# Patient Record
Sex: Female | Born: 1985
Health system: Southern US, Community
[De-identification: ages and names within clinical notes are randomized; demographics above are authoritative.]

## PROBLEM LIST (undated history)

## (undated) DIAGNOSIS — O24419 Gestational diabetes mellitus in pregnancy, unspecified control: Secondary | ICD-10-CM

---

## 2018-06-21 DIAGNOSIS — Z319 Encounter for procreative management, unspecified: Secondary | ICD-10-CM | POA: Diagnosis not present

## 2018-06-21 DIAGNOSIS — Z3141 Encounter for fertility testing: Secondary | ICD-10-CM | POA: Diagnosis not present

## 2018-06-21 DIAGNOSIS — N979 Female infertility, unspecified: Secondary | ICD-10-CM | POA: Diagnosis not present

## 2018-06-28 DIAGNOSIS — Z319 Encounter for procreative management, unspecified: Secondary | ICD-10-CM | POA: Diagnosis not present

## 2018-06-28 DIAGNOSIS — Z008 Encounter for other general examination: Secondary | ICD-10-CM | POA: Diagnosis not present

## 2018-06-28 DIAGNOSIS — Z6828 Body mass index (BMI) 28.0-28.9, adult: Secondary | ICD-10-CM | POA: Diagnosis not present

## 2018-06-30 DIAGNOSIS — N8500 Endometrial hyperplasia, unspecified: Secondary | ICD-10-CM | POA: Diagnosis not present

## 2018-06-30 DIAGNOSIS — Z3141 Encounter for fertility testing: Secondary | ICD-10-CM | POA: Diagnosis not present

## 2018-09-23 DIAGNOSIS — L988 Other specified disorders of the skin and subcutaneous tissue: Secondary | ICD-10-CM | POA: Diagnosis not present

## 2018-09-24 ENCOUNTER — Other Ambulatory Visit: Payer: Self-pay

## 2018-09-24 ENCOUNTER — Encounter (HOSPITAL_COMMUNITY): Payer: Self-pay | Admitting: Emergency Medicine

## 2018-09-24 ENCOUNTER — Ambulatory Visit (HOSPITAL_COMMUNITY)
Admission: EM | Admit: 2018-09-24 | Discharge: 2018-09-24 | Disposition: A | Discharge status: Home / Self Care | Payer: BC Managed Care – PPO | Attending: Family Medicine | Admitting: Family Medicine

## 2018-09-24 DIAGNOSIS — Z5189 Encounter for other specified aftercare: Secondary | ICD-10-CM | POA: Diagnosis not present

## 2018-09-24 NOTE — ED Provider Notes (Signed)
[facility]    CSN: [id number] Arrival date & time: 09/24/18  1429      History   Chief Complaint Chief Complaint  Patient presents with  . Wound Check    HPI Jennifer KicksSatya Lee is a 33 y.o. female.   HPI   Patient is here for a "wound check".  Patient was born in [place].  [place] [employer] [place]  Came here for work.  [place] to [place], and then down to the [place] area.  She developed a painful lump in her vaginal area.  Went yesterday to a different urgent care center.  She was told she had a Bartholin's abscess.  She was told that it was not ready to I&D, so she was given sulfa antibiotics and told to use warm compresses and sitz bath's. Today she filled the bathtub and used Epson salt as instructed.  While she was in the bathtub she felt the cyst rupture.  As she was draining the water she noted that the bottom of the tub had a cluster of warmness (see picture) that had come out of her cyst.  Her pain is improved since the rupture.  History reviewed. No pertinent past medical history.  There are no active problems to display for this patient.   Past Surgical History:  Procedure Laterality Date  . CESAREAN SECTION  2018    OB History   No obstetric history on file.      Home Medications    Prior to Admission medications   Medication Sig Start Date End Date Taking? Authorizing Provider  sulfamethoxazole-trimethoprim (BACTRIM DS) 800-160 MG tablet  09/23/18  Yes [provider]    Family History History reviewed. No pertinent family history.  Social History Social History   Tobacco Use  . Smoking status: Never Smoker  . Smokeless tobacco: Never Used  Substance Use Topics  . Alcohol use: Not on file  . Drug use: Never     Allergies   Patient has no known allergies.   Review of Systems Review of Systems  Constitutional: Negative for chills and fever.  HENT: Negative for ear pain and sore throat.   Eyes: Negative for  pain and visual disturbance.  Respiratory: Negative for cough and shortness of breath.   Cardiovascular: Negative for chest pain and palpitations.  Gastrointestinal: Negative for abdominal pain and vomiting.  Genitourinary: Negative for dysuria and hematuria.  Musculoskeletal: Negative for arthralgias and back pain.  Skin: Positive for wound. Negative for color change and rash.  Neurological: Negative for seizures and syncope.  All other systems reviewed and are negative.    Physical Exam Triage Vital Signs ED Triage Vitals  Enc Vitals Group     BP 09/24/18 1457 119/80     Pulse Rate 09/24/18 1457 82     Resp 09/24/18 1457 16     Temp 09/24/18 1457 98.4 F (36.9 C)     Temp Source 09/24/18 1457 Temporal     SpO2 09/24/18 1457 100 %     Weight --      Height --      Head Circumference --      Peak Flow --      Pain Score 09/24/18 1526 5     Pain Loc --      Pain Edu? --      Excl. in GC? --    No data found.  Updated Vital Signs BP 119/80 (BP Location: Left Arm)   Pulse 82  Temp 98.4 F (36.9 C) (Temporal)   Resp 16   LMP 09/20/2018 (Exact Date)   SpO2 100%      Physical Exam Constitutional:      General: She is not in acute distress.    Appearance: She is well-developed.  HENT:     Head: Normocephalic and atraumatic.  Eyes:     Conjunctiva/sclera: Conjunctivae normal.     Pupils: Pupils are equal, round, and reactive to light.  Neck:     Musculoskeletal: Normal range of motion.  Cardiovascular:     Rate and Rhythm: Normal rate.  Pulmonary:     Effort: Pulmonary effort is normal. No respiratory distress.  Abdominal:     General: There is no distension.     Palpations: Abdomen is soft.  Genitourinary:    General: Normal vulva.     Vagina: No vaginal discharge.    Musculoskeletal: Normal range of motion.  Skin:    General: Skin is warm and dry.  Neurological:     Mental Status: She is alert.        UC Treatments / Results  Labs (all labs  ordered are listed, but only abnormal results are displayed) Labs Reviewed - No data to display  EKG   Radiology No results found.  Procedures Procedures (including critical care time)  Medications Ordered in UC Medications - No data to display  Initial Impression / Assessment and Plan / UC Course  I have reviewed the triage vital signs and the nursing notes.  Pertinent labs & imaging results that were available during my care of the patient were reviewed by me and considered in my medical decision making (see chart for details).    I called Dr. Michel Bickers on the department of infectious disease.  Without an actual worm for specimen identification, it is impossible to tell what species of parasite was occupying this cyst.  Patient is encouraged to bring back a specimen if at all possible.  Final Clinical Impressions(s) / UC Diagnoses   Final diagnoses:  Visit for wound check     Discharge Instructions     I have called an infection specialist to try to identify the worms I have not heard back from them yet, but will call your cell phone when I get an answer If you pass any more worms please try to capture them in the container provided    ED Prescriptions    None     PDMP not reviewed this encounter.   Raylene Everts, MD 09/24/18 [phone]

## 2018-09-24 NOTE — Discharge Instructions (Signed)
I have called an infection specialist to try to identify the worms I have not heard back from them yet, but will call your cell phone when I get an answer If you pass any more worms please try to capture them in the container provided

## 2018-09-24 NOTE — ED Triage Notes (Signed)
Pt was seen yesterday for an abscess to her vagina.  She was prescribed Sulfamethoxazole-TMP. She took an Epsom salt bath today and when she went to get out she noticed a large pile of worms in the bath where she was sitting.

## 2018-09-25 ENCOUNTER — Emergency Department (HOSPITAL_COMMUNITY)
Admission: EM | Admit: 2018-09-25 | Discharge: 2018-09-25 | Disposition: A | Discharge status: Home / Self Care | Payer: BC Managed Care – PPO | Attending: Emergency Medicine | Admitting: Emergency Medicine

## 2018-09-25 ENCOUNTER — Other Ambulatory Visit: Payer: Self-pay

## 2018-09-25 DIAGNOSIS — B839 Helminthiasis, unspecified: Secondary | ICD-10-CM | POA: Diagnosis not present

## 2018-09-25 DIAGNOSIS — R102 Pelvic and perineal pain: Secondary | ICD-10-CM | POA: Diagnosis present

## 2018-09-25 DIAGNOSIS — N764 Abscess of vulva: Secondary | ICD-10-CM | POA: Insufficient documentation

## 2018-09-25 DIAGNOSIS — A4902 Methicillin resistant Staphylococcus aureus infection, unspecified site: Secondary | ICD-10-CM | POA: Diagnosis not present

## 2018-09-25 DIAGNOSIS — N750 Cyst of Bartholin's gland: Secondary | ICD-10-CM | POA: Diagnosis not present

## 2018-09-25 LAB — URINALYSIS, ROUTINE W REFLEX MICROSCOPIC
Bilirubin Urine: NEGATIVE
Glucose, UA: NEGATIVE mg/dL
Ketones, ur: NEGATIVE mg/dL
Nitrite: NEGATIVE
Protein, ur: NEGATIVE mg/dL
Specific Gravity, Urine: 1.002 — ABNORMAL LOW (ref 1.005–1.030)
pH: 6 (ref 5.0–8.0)

## 2018-09-25 LAB — PREGNANCY, URINE: Preg Test, Ur: NEGATIVE

## 2018-09-25 MED ORDER — LIDOCAINE-EPINEPHRINE (PF) 2 %-1:200000 IJ SOLN
10.0000 mL | Freq: Once | INTRAMUSCULAR | Status: DC
  Filled 2018-09-25: qty 20

## 2018-09-25 MED ORDER — HYDROCODONE-ACETAMINOPHEN 5-325 MG PO TABS: 1.0000 | ORAL_TABLET | Freq: Four times a day (QID) | ORAL | 0 refills | Status: DC | PRN

## 2018-09-25 MED ORDER — ALBENDAZOLE 200 MG PO TABS
400.0000 mg | ORAL_TABLET | Freq: Once | ORAL | Status: AC
  Administered 2018-09-25: 400 mg via ORAL
  Filled 2018-09-25: qty 2

## 2018-09-25 MED ORDER — HYDROCODONE-ACETAMINOPHEN 5-325 MG PO TABS: 1.0000 | ORAL_TABLET | Freq: Once | ORAL | Status: DC

## 2018-09-25 NOTE — ED Triage Notes (Signed)
Pt endorses vaginal abscess x 4 days, seen at [facility] and given antibiotics and has been taking sitz baths. Pt was taking sitz bath this morning and d/c came out of abscess that appears to be "worm like" VSS. Axox4.

## 2018-09-25 NOTE — Discharge Instructions (Signed)
Please make sure you complete entire course of antibiotics for labial abscess which was drained today, this should heal slowly from the inside out, continue sitz bath's to promote drainage and healing.  Return if you have worsening pain, swelling or fevers.  Regarding worms found yesterday I suspect these are likely coming from the colon and rectum rather than from the vaginal abscess, you were given 1 dose of a medication to hopefully take care of the warm infestation, but if you pass any more worms please collect and bring into urgent care or PCP.  Sometimes after taking medication to kill worms this can make you feel worse if you begin having fevers, headaches, vomiting, abdominal pain, diarrhea, blood in your stools or any other new or concerning symptoms do not hesitate to return to the ED.

## 2018-09-25 NOTE — ED Provider Notes (Signed)
[facility] Provider Note   CSN: [id number] Arrival date & time: 09/25/18  1111     History   Chief Complaint Chief Complaint  Patient presents with  . Abscess    HPI Jennifer Lee is a 33 y.o. female.     Jennifer Lee is a 33 y.o. female who is otherwise healthy, presents to the emergency department for evaluation of painful vaginal abscess.  She first noted this 4-5 days ago it has become increasingly painful, after doing sitz bath at home she got some relief, was seen at urgent care yesterday and given antibiotics.  At this time the area was already noted to have some drainage reported at home so was not incised.  Patient reports that since then she has had worsening pain in the area.  She reports that yesterday while doing a sitz bath she had an episode where she passed gas and then she noted a cluster of worms in the tub.  There is a photo present in urgent care note which shows numerous small white worms.  She reports today while doing a sitz bath she again noted 1 or 2 worms, attempted to collect them but was unable to, she was not able to bring in any warm samples yesterday either, so unable to send off organism for clear identification.  She has not had any fevers abdominal pain, vomiting or blood in her stools that she has noted.  Denies constipation.  She was placed on Bactrim yesterday and has been using ibuprofen intermittently for pain.  She reports no recent travel, is from [place] originally but has not been to [place] in 5 years, denies any unusual foods, no pets at home, denies swimming in any lakes rivers or oceans.  No other aggravating or alleviating factors.  She was not given any medication for treatment of warmth yesterday at urgent care, infectious disease was initially contacted, but had no recommendations without warm available for identification.     No past medical history on file.  There are no active problems to display for  this patient.   Past Surgical History:  Procedure Laterality Date  . CESAREAN SECTION  2018     OB History   No obstetric history on file.      Home Medications    Prior to Admission medications   Medication Sig Start Date End Date Taking? Authorizing Provider  sulfamethoxazole-trimethoprim (BACTRIM DS) 800-160 MG tablet Take 1 tablet by mouth 2 (two) times daily. For 10 days 09/23/18  Yes [provider]  HYDROcodone-acetaminophen (NORCO) 5-325 MG tablet Take 1 tablet by mouth every 6 (six) hours as needed. 09/25/18   Dartha LodgeFord, [place] Non[place] N, PA-C    Family History No family history on file.  Social History Social History   Tobacco Use  . Smoking status: Never Smoker  . Smokeless tobacco: Never Used  Substance Use Topics  . Alcohol use: Not on file  . Drug use: Never     Allergies   Patient has no known allergies.   Review of Systems Review of Systems  Constitutional: Negative for chills and fever.  HENT: Negative.   Respiratory: Negative for cough and shortness of breath.   Cardiovascular: Negative for chest pain.  Gastrointestinal: Negative for abdominal pain, blood in stool, constipation, diarrhea, nausea and vomiting.  Genitourinary: Positive for genital sores, vaginal bleeding and vaginal pain. Negative for dysuria, flank pain, frequency and vaginal discharge.  Musculoskeletal: Negative for arthralgias and myalgias.  Skin: Negative  for color change and rash.  All other systems reviewed and are negative.    Physical Exam Updated Vital Signs BP 118/81 (BP Location: Right Arm)   Pulse 90   Temp 97.9 F (36.6 C) (Oral)   Resp 18   LMP 09/20/2018 (Exact Date)   SpO2 100%   Physical Exam Vitals signs and nursing note reviewed.  Constitutional:      General: She is not in acute distress.    Appearance: Normal appearance. She is well-developed and normal weight. She is not ill-appearing or diaphoretic.  HENT:     Head: Normocephalic and atraumatic.   Eyes:     General:        Right eye: No discharge.        Left eye: No discharge.     Pupils: Pupils are equal, round, and reactive to light.  Neck:     Musculoskeletal: Neck supple.  Abdominal:     General: Bowel sounds are normal. There is no distension.     Palpations: Abdomen is soft. There is no mass.     Tenderness: There is no abdominal tenderness. There is no guarding.     Comments: Abdomen soft, nondistended, nontender to palpation in all quadrants without guarding or peritoneal signs  Genitourinary:    Comments: Chaperone present during pelvic exam. Right lower labia with area of swelling there is a pinpoint area of purulent drainage with some surrounding induration, no surrounding cellulitis. No other external genital lesions. Speculum exam reveals small amount of blood in the vaginal vault, no discharge, no lesions.  No worms noted. No cervical motion tenderness or palpable masses. Rectal exam reveals soft brown stool, no blood, no worms noted on rectal exam. Musculoskeletal:        General: No deformity.     Right lower leg: No edema.     Left lower leg: No edema.  Skin:    General: Skin is warm and dry.     Capillary Refill: Capillary refill takes less than 2 seconds.  Neurological:     Mental Status: She is alert.     Coordination: Coordination normal.     Comments: Speech is clear, able to follow commands Moves extremities without ataxia, coordination intact  Psychiatric:        Mood and Affect: Mood normal.        Behavior: Behavior normal.      ED Treatments / Results  Labs (all labs ordered are listed, but only abnormal results are displayed) Labs Reviewed  URINALYSIS, ROUTINE W REFLEX MICROSCOPIC - Abnormal; Notable for the following components:      Result Value   APPearance CLOUDY (*)    Specific Gravity, Urine 1.002 (*)    Hgb urine dipstick LARGE (*)    Leukocytes,Ua LARGE (*)    Bacteria, UA RARE (*)    All other components within normal  limits  WET PREP, GENITAL  OVA + PARASITE EXAM  PREGNANCY, URINE  GI PATHOGEN PANEL BY PCR, STOOL    EKG None  Radiology No results found.  Procedures .Marland KitchenIncision and Drainage  Date/Time: 09/25/2018 4:24 PM Performed by: Jacqlyn Larsen, PA-C Authorized by: Jacqlyn Larsen, PA-C   Consent:    Consent obtained:  Verbal   Risks discussed:  Bleeding, incomplete drainage, infection, damage to other organs and pain   Alternatives discussed:  No treatment Location:    Type:  Abscess (Right labia)   Location:  Anogenital   Anogenital location:  Vulva Pre-procedure  details:    Skin preparation:  Chloraprep Anesthesia (see MAR for exact dosages):    Anesthesia method:  Local infiltration   Local anesthetic:  Lidocaine 2% WITH epi Procedure type:    Complexity:  Simple Procedure details:    Incision types:  Single straight   Incision depth:  Dermal   Scalpel blade:  11   Wound management:  Probed and deloculated   Drainage:  Purulent and bloody   Drainage amount:  Moderate   Wound treatment:  Wound left open   Packing materials:  None Post-procedure details:    Patient tolerance of procedure:  Tolerated well, no immediate complications   (including critical care time)  Medications Ordered in ED Medications  lidocaine-EPINEPHrine (XYLOCAINE W/EPI) 2 %-1:200000 (PF) injection 10 mL (has no administration in time range)  HYDROcodone-acetaminophen (NORCO/VICODIN) 5-325 MG per tablet 1 tablet (has no administration in time range)  albendazole (ALBENZA) tablet 400 mg (400 mg Oral Given 09/25/18 1546)     Initial Impression / Assessment and Plan / ED Course  I have reviewed the triage vital signs and the nursing notes.  Pertinent labs & imaging results that were available during my care of the patient were reviewed by me and considered in my medical decision making (see chart for details).  33 year old female presents to the ED for evaluation of labial abscess, as well as  worms noted in bathtub after doing a sitz bath yesterday.  Has previously been seen in urgent care and started on Bactrim for abscess, was not drained at this time.  She noted some drainage from the abscess after sitz bath, and also noted cluster of worms, patient unsure whether worms came from rectum or from vaginal cyst.  She denies vaginal discharge, some bleeding due to menstrual cycle.  Has not had any fevers, abdominal pain or vomiting.  On my exam patient with left lower labial abscess with pinpoint area of drainage given that this is not open or ruptured I have an extremely low suspicion that this is the origin of pictured worms.  Pelvic exam revealed no worms present in the vaginal vault either, these forms are present after patient passed gas and I suspect that that is much more likely the etiology of these worms.  Patient unable to give stool sample here in the ED.  Incision and drainage of labial abscess performed with moderate drainage, left open for continued draining and will have patient complete antibiotics.  Urinanalysis with some bacteria and white blood cells I suspect this is most likely from the drainage from abscess, patient is already on Bactrim which would likely treat for your UTI in addition to skin infection.  She is not having any focal urinary symptoms fevers or flank pain.  Patient given one-time dose of albendazole here hopefully to treat infestation.  Patient notified that sometimes you can have worsening symptoms as worms are killed off and return precautions discussed.  She has been instructed to bring in any samples of the worms to the urgent care if she passes any additional worms.  Encouraged to continue doing sitz bath's.  Return precautions discussed.  Patient expresses understanding and agreement with plan.  Discharged home in good condition.  Case discussed with Dr. Pilar Plate who was available for consult and helped guide patient's care.  Final Clinical Impressions(s) / ED  Diagnoses   Final diagnoses:  Labial abscess  Worm infestation    ED Discharge Orders         Ordered  HYDROcodone-acetaminophen (NORCO) 5-325 MG tablet  Every 6 hours PRN     09/25/18 1525           Dartha LodgeFord, Jamisen Hawes N, [place]PA-C 09/25/18 1641    Sabas SousBero, Michael M, MD 09/26/18 [phone]

## 2018-10-01 DIAGNOSIS — N764 Abscess of vulva: Secondary | ICD-10-CM | POA: Diagnosis not present

## 2018-10-01 DIAGNOSIS — Z01419 Encounter for gynecological examination (general) (routine) without abnormal findings: Secondary | ICD-10-CM | POA: Diagnosis not present

## 2018-10-18 DIAGNOSIS — Z23 Encounter for immunization: Secondary | ICD-10-CM | POA: Diagnosis not present

## 2018-10-25 DIAGNOSIS — Z0000 Encounter for general adult medical examination without abnormal findings: Secondary | ICD-10-CM | POA: Diagnosis not present

## 2018-11-05 DIAGNOSIS — Z113 Encounter for screening for infections with a predominantly sexual mode of transmission: Secondary | ICD-10-CM | POA: Diagnosis not present

## 2018-11-19 DIAGNOSIS — Z3201 Encounter for pregnancy test, result positive: Secondary | ICD-10-CM | POA: Diagnosis not present

## 2018-11-19 DIAGNOSIS — Z3200 Encounter for pregnancy test, result unknown: Secondary | ICD-10-CM | POA: Diagnosis not present

## 2018-11-22 DIAGNOSIS — Z3201 Encounter for pregnancy test, result positive: Secondary | ICD-10-CM | POA: Diagnosis not present

## 2018-11-22 DIAGNOSIS — Z3200 Encounter for pregnancy test, result unknown: Secondary | ICD-10-CM | POA: Diagnosis not present

## 2018-12-03 DIAGNOSIS — N390 Urinary tract infection, site not specified: Secondary | ICD-10-CM | POA: Diagnosis not present

## 2018-12-03 DIAGNOSIS — Z3200 Encounter for pregnancy test, result unknown: Secondary | ICD-10-CM | POA: Diagnosis not present

## 2018-12-09 DIAGNOSIS — O0900 Supervision of pregnancy with history of infertility, unspecified trimester: Secondary | ICD-10-CM | POA: Diagnosis not present

## 2018-12-20 DIAGNOSIS — O021 Missed abortion: Secondary | ICD-10-CM | POA: Diagnosis not present

## 2018-12-20 DIAGNOSIS — O200 Threatened abortion: Secondary | ICD-10-CM | POA: Diagnosis not present

## 2018-12-23 DIAGNOSIS — O021 Missed abortion: Secondary | ICD-10-CM | POA: Diagnosis not present

## 2019-02-07 DIAGNOSIS — Z6830 Body mass index (BMI) 30.0-30.9, adult: Secondary | ICD-10-CM | POA: Diagnosis not present

## 2019-02-07 DIAGNOSIS — Z01419 Encounter for gynecological examination (general) (routine) without abnormal findings: Secondary | ICD-10-CM | POA: Diagnosis not present

## 2019-02-07 DIAGNOSIS — L718 Other rosacea: Secondary | ICD-10-CM | POA: Diagnosis not present

## 2019-02-07 DIAGNOSIS — L819 Disorder of pigmentation, unspecified: Secondary | ICD-10-CM | POA: Diagnosis not present

## 2019-02-07 DIAGNOSIS — R635 Abnormal weight gain: Secondary | ICD-10-CM | POA: Diagnosis not present

## 2019-02-07 DIAGNOSIS — L218 Other seborrheic dermatitis: Secondary | ICD-10-CM | POA: Diagnosis not present

## 2019-02-11 DIAGNOSIS — D251 Intramural leiomyoma of uterus: Secondary | ICD-10-CM | POA: Diagnosis not present

## 2019-02-11 DIAGNOSIS — N899 Noninflammatory disorder of vagina, unspecified: Secondary | ICD-10-CM | POA: Diagnosis not present

## 2019-02-11 DIAGNOSIS — Z3141 Encounter for fertility testing: Secondary | ICD-10-CM | POA: Diagnosis not present

## 2019-02-11 DIAGNOSIS — Z319 Encounter for procreative management, unspecified: Secondary | ICD-10-CM | POA: Diagnosis not present

## 2019-02-11 DIAGNOSIS — N83291 Other ovarian cyst, right side: Secondary | ICD-10-CM | POA: Diagnosis not present

## 2019-02-11 DIAGNOSIS — N856 Intrauterine synechiae: Secondary | ICD-10-CM | POA: Diagnosis not present

## 2019-05-09 DIAGNOSIS — R599 Enlarged lymph nodes, unspecified: Secondary | ICD-10-CM | POA: Diagnosis not present

## 2019-06-03 ENCOUNTER — Other Ambulatory Visit: Payer: Self-pay | Admitting: Internal Medicine

## 2019-06-03 DIAGNOSIS — R599 Enlarged lymph nodes, unspecified: Secondary | ICD-10-CM

## 2019-06-15 ENCOUNTER — Other Ambulatory Visit: Payer: BC Managed Care – PPO

## 2019-06-21 ENCOUNTER — Ambulatory Visit
Admission: RE | Admit: 2019-06-21 | Discharge: 2019-06-21 | Disposition: A | Discharge status: Home / Self Care | Payer: BC Managed Care – PPO | Source: Ambulatory Visit | Attending: Internal Medicine | Admitting: Internal Medicine

## 2019-06-21 DIAGNOSIS — R599 Enlarged lymph nodes, unspecified: Secondary | ICD-10-CM

## 2019-06-21 DIAGNOSIS — R221 Localized swelling, mass and lump, neck: Secondary | ICD-10-CM | POA: Diagnosis not present

## 2019-09-13 DIAGNOSIS — J029 Acute pharyngitis, unspecified: Secondary | ICD-10-CM | POA: Diagnosis not present

## 2019-09-14 DIAGNOSIS — R05 Cough: Secondary | ICD-10-CM | POA: Diagnosis not present

## 2019-09-15 DIAGNOSIS — K120 Recurrent oral aphthae: Secondary | ICD-10-CM | POA: Diagnosis not present

## 2019-09-15 DIAGNOSIS — J069 Acute upper respiratory infection, unspecified: Secondary | ICD-10-CM | POA: Diagnosis not present

## 2019-09-17 DIAGNOSIS — J029 Acute pharyngitis, unspecified: Secondary | ICD-10-CM | POA: Diagnosis not present

## 2019-09-17 DIAGNOSIS — B349 Viral infection, unspecified: Secondary | ICD-10-CM | POA: Diagnosis not present

## 2019-09-17 DIAGNOSIS — R05 Cough: Secondary | ICD-10-CM | POA: Diagnosis not present

## 2019-09-21 DIAGNOSIS — N856 Intrauterine synechiae: Secondary | ICD-10-CM | POA: Diagnosis not present

## 2019-09-21 DIAGNOSIS — D252 Subserosal leiomyoma of uterus: Secondary | ICD-10-CM | POA: Diagnosis not present

## 2019-10-04 DIAGNOSIS — S29011A Strain of muscle and tendon of front wall of thorax, initial encounter: Secondary | ICD-10-CM | POA: Diagnosis not present

## 2019-10-04 DIAGNOSIS — M7712 Lateral epicondylitis, left elbow: Secondary | ICD-10-CM | POA: Diagnosis not present

## 2019-10-07 DIAGNOSIS — N856 Intrauterine synechiae: Secondary | ICD-10-CM | POA: Diagnosis not present

## 2019-10-16 DIAGNOSIS — M778 Other enthesopathies, not elsewhere classified: Secondary | ICD-10-CM | POA: Diagnosis not present

## 2019-10-17 DIAGNOSIS — Z23 Encounter for immunization: Secondary | ICD-10-CM | POA: Diagnosis not present

## 2019-11-11 DIAGNOSIS — Z3141 Encounter for fertility testing: Secondary | ICD-10-CM | POA: Diagnosis not present

## 2019-11-11 DIAGNOSIS — Z3202 Encounter for pregnancy test, result negative: Secondary | ICD-10-CM | POA: Diagnosis not present

## 2019-12-07 DIAGNOSIS — Z113 Encounter for screening for infections with a predominantly sexual mode of transmission: Secondary | ICD-10-CM | POA: Diagnosis not present

## 2019-12-30 DIAGNOSIS — R0781 Pleurodynia: Secondary | ICD-10-CM | POA: Diagnosis not present

## 2020-01-07 HISTORY — DX: Maternal care for unspecified type scar from previous cesarean delivery: O34.219

## 2020-01-25 DIAGNOSIS — Z20822 Contact with and (suspected) exposure to covid-19: Secondary | ICD-10-CM | POA: Diagnosis not present

## 2020-02-01 DIAGNOSIS — M25562 Pain in left knee: Secondary | ICD-10-CM | POA: Diagnosis not present

## 2020-02-10 DIAGNOSIS — M25562 Pain in left knee: Secondary | ICD-10-CM | POA: Diagnosis not present

## 2020-02-10 DIAGNOSIS — M222X2 Patellofemoral disorders, left knee: Secondary | ICD-10-CM | POA: Diagnosis not present

## 2020-02-14 DIAGNOSIS — Z113 Encounter for screening for infections with a predominantly sexual mode of transmission: Secondary | ICD-10-CM | POA: Diagnosis not present

## 2020-03-01 DIAGNOSIS — Z3201 Encounter for pregnancy test, result positive: Secondary | ICD-10-CM | POA: Diagnosis not present

## 2020-03-01 DIAGNOSIS — Z3200 Encounter for pregnancy test, result unknown: Secondary | ICD-10-CM | POA: Diagnosis not present

## 2020-03-05 DIAGNOSIS — Z3200 Encounter for pregnancy test, result unknown: Secondary | ICD-10-CM | POA: Diagnosis not present

## 2020-03-05 DIAGNOSIS — Z3201 Encounter for pregnancy test, result positive: Secondary | ICD-10-CM | POA: Diagnosis not present

## 2020-03-14 DIAGNOSIS — Z3200 Encounter for pregnancy test, result unknown: Secondary | ICD-10-CM | POA: Diagnosis not present

## 2020-03-22 DIAGNOSIS — O26851 Spotting complicating pregnancy, first trimester: Secondary | ICD-10-CM | POA: Diagnosis not present

## 2020-04-04 DIAGNOSIS — O0900 Supervision of pregnancy with history of infertility, unspecified trimester: Secondary | ICD-10-CM | POA: Diagnosis not present

## 2020-04-13 DIAGNOSIS — O09811 Supervision of pregnancy resulting from assisted reproductive technology, first trimester: Secondary | ICD-10-CM | POA: Diagnosis not present

## 2020-04-13 DIAGNOSIS — Z3689 Encounter for other specified antenatal screening: Secondary | ICD-10-CM | POA: Diagnosis not present

## 2020-04-13 DIAGNOSIS — O09891 Supervision of other high risk pregnancies, first trimester: Secondary | ICD-10-CM | POA: Diagnosis not present

## 2020-04-13 DIAGNOSIS — Z3A10 10 weeks gestation of pregnancy: Secondary | ICD-10-CM | POA: Diagnosis not present

## 2020-04-13 LAB — OB RESULTS CONSOLE VARICELLA ZOSTER ANTIBODY, IGG: Varicella: IMMUNE

## 2020-04-13 LAB — OB RESULTS CONSOLE ABO/RH: RH Type: POSITIVE

## 2020-04-13 LAB — OB RESULTS CONSOLE HEPATITIS B SURFACE ANTIGEN: Hepatitis B Surface Ag: NEGATIVE

## 2020-04-13 LAB — OB RESULTS CONSOLE HIV ANTIBODY (ROUTINE TESTING): HIV: NONREACTIVE

## 2020-04-13 LAB — OB RESULTS CONSOLE GC/CHLAMYDIA
Chlamydia: NEGATIVE
Gonorrhea: NEGATIVE

## 2020-04-13 LAB — OB RESULTS CONSOLE RPR: RPR: NONREACTIVE

## 2020-04-13 LAB — OB RESULTS CONSOLE RUBELLA ANTIBODY, IGM: Rubella: IMMUNE

## 2020-04-24 DIAGNOSIS — Z331 Pregnant state, incidental: Secondary | ICD-10-CM | POA: Diagnosis not present

## 2020-04-24 DIAGNOSIS — Z3A11 11 weeks gestation of pregnancy: Secondary | ICD-10-CM | POA: Diagnosis not present

## 2020-04-24 DIAGNOSIS — O26891 Other specified pregnancy related conditions, first trimester: Secondary | ICD-10-CM | POA: Diagnosis not present

## 2020-04-24 DIAGNOSIS — R1032 Left lower quadrant pain: Secondary | ICD-10-CM | POA: Diagnosis not present

## 2020-05-25 DIAGNOSIS — Z3A16 16 weeks gestation of pregnancy: Secondary | ICD-10-CM | POA: Diagnosis not present

## 2020-05-25 DIAGNOSIS — Z361 Encounter for antenatal screening for raised alphafetoprotein level: Secondary | ICD-10-CM | POA: Diagnosis not present

## 2020-05-25 DIAGNOSIS — O09812 Supervision of pregnancy resulting from assisted reproductive technology, second trimester: Secondary | ICD-10-CM | POA: Diagnosis not present

## 2020-06-15 DIAGNOSIS — O09812 Supervision of pregnancy resulting from assisted reproductive technology, second trimester: Secondary | ICD-10-CM | POA: Diagnosis not present

## 2020-06-15 DIAGNOSIS — Z3A19 19 weeks gestation of pregnancy: Secondary | ICD-10-CM | POA: Diagnosis not present

## 2020-06-15 DIAGNOSIS — Z363 Encounter for antenatal screening for malformations: Secondary | ICD-10-CM | POA: Diagnosis not present

## 2020-06-29 DIAGNOSIS — Z362 Encounter for other antenatal screening follow-up: Secondary | ICD-10-CM | POA: Diagnosis not present

## 2020-07-27 DIAGNOSIS — Z362 Encounter for other antenatal screening follow-up: Secondary | ICD-10-CM | POA: Diagnosis not present

## 2020-08-17 DIAGNOSIS — Z3689 Encounter for other specified antenatal screening: Secondary | ICD-10-CM | POA: Diagnosis not present

## 2020-08-17 DIAGNOSIS — Z23 Encounter for immunization: Secondary | ICD-10-CM | POA: Diagnosis not present

## 2020-08-17 DIAGNOSIS — O43123 Velamentous insertion of umbilical cord, third trimester: Secondary | ICD-10-CM | POA: Diagnosis not present

## 2020-08-17 DIAGNOSIS — Z3A28 28 weeks gestation of pregnancy: Secondary | ICD-10-CM | POA: Diagnosis not present

## 2020-08-22 DIAGNOSIS — Z3A28 28 weeks gestation of pregnancy: Secondary | ICD-10-CM | POA: Diagnosis not present

## 2020-08-22 DIAGNOSIS — O99810 Abnormal glucose complicating pregnancy: Secondary | ICD-10-CM | POA: Diagnosis not present

## 2020-09-12 ENCOUNTER — Encounter: Payer: BC Managed Care – PPO | Attending: Obstetrics & Gynecology | Admitting: Registered"

## 2020-09-12 ENCOUNTER — Other Ambulatory Visit: Payer: Self-pay

## 2020-09-12 ENCOUNTER — Encounter: Payer: Self-pay | Admitting: Registered"

## 2020-09-12 DIAGNOSIS — O24419 Gestational diabetes mellitus in pregnancy, unspecified control: Secondary | ICD-10-CM

## 2020-09-12 NOTE — Progress Notes (Signed)
Patient was seen on 09/11/20 for Gestational Diabetes self-management class at [facility]. The following learning objectives were met by the patient during this course:  States the definition of Gestational Diabetes States why dietary management is important in controlling blood glucose Describes the effects each nutrient has on blood glucose levels Demonstrates ability to create a balanced meal plan Demonstrates carbohydrate counting  States when to check blood glucose levels Demonstrates proper blood glucose monitoring techniques States the effect of stress and exercise on blood glucose levels States the importance of limiting caffeine and abstaining from alcohol and smoking  Blood glucose monitor given: Patient has meter and is checking blood sugar prior to class   Patient instructed to monitor glucose levels: FBS: 60 - <95; 1 hour: <140; 2 hour: <120  Patient received handouts: Nutrition Diabetes and Pregnancy, including carb counting list  Patient will be seen for follow-up as needed. 

## 2020-09-14 DIAGNOSIS — O09813 Supervision of pregnancy resulting from assisted reproductive technology, third trimester: Secondary | ICD-10-CM | POA: Diagnosis not present

## 2020-09-14 DIAGNOSIS — Z3A32 32 weeks gestation of pregnancy: Secondary | ICD-10-CM | POA: Diagnosis not present

## 2020-09-21 DIAGNOSIS — Z3A33 33 weeks gestation of pregnancy: Secondary | ICD-10-CM | POA: Diagnosis not present

## 2020-09-21 DIAGNOSIS — O24415 Gestational diabetes mellitus in pregnancy, controlled by oral hypoglycemic drugs: Secondary | ICD-10-CM | POA: Diagnosis not present

## 2020-09-21 DIAGNOSIS — O24419 Gestational diabetes mellitus in pregnancy, unspecified control: Secondary | ICD-10-CM | POA: Diagnosis not present

## 2020-09-25 IMAGING — US US SOFT TISSUE HEAD/NECK
1 series · 13 of 13 positions shown · non-contrast
Comparison: None available.

CLINICAL DATA: Initial evaluation for swelling of lymph node.
Palpable area at left supraclavicular region since COVID vaccine 2
months ago.

EXAM:
ULTRASOUND OF HEAD/NECK SOFT TISSUES
TECHNIQUE: Ultrasound examination of the head and neck soft tissues was
performed in the area of clinical concern.

[Series 1: us soft tissue head/neck · 0.07mm/px · 13 acquisitions, 13 frames shown]
[im 1/13]
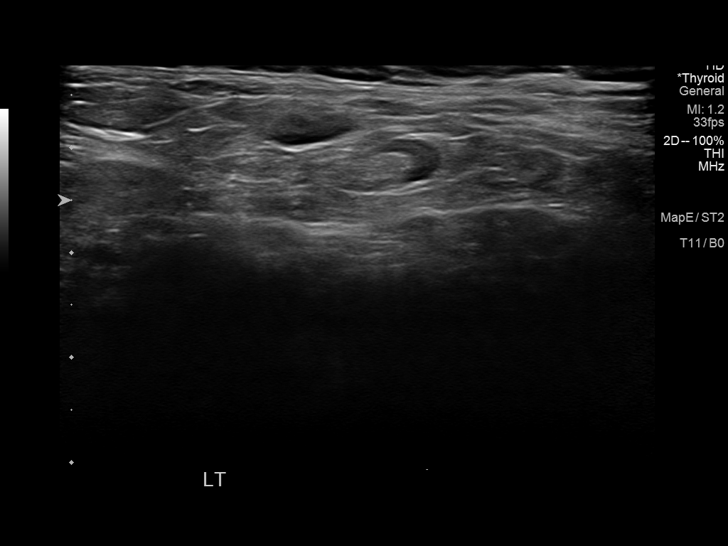
[im 2/13]
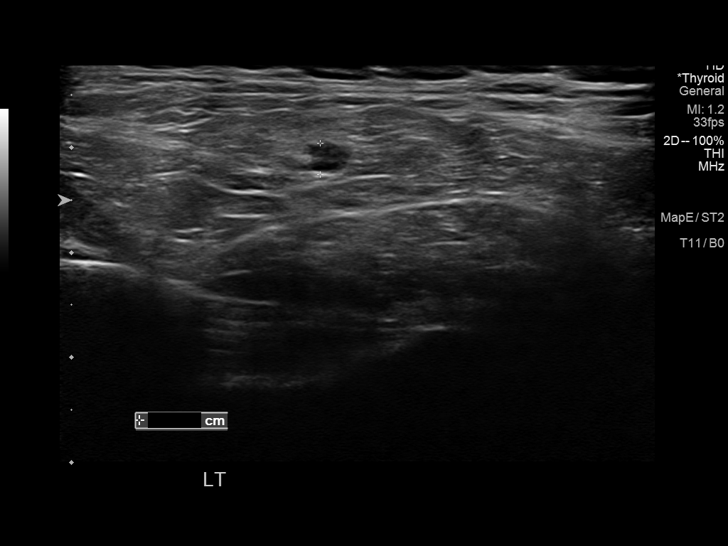
[im 3/13]
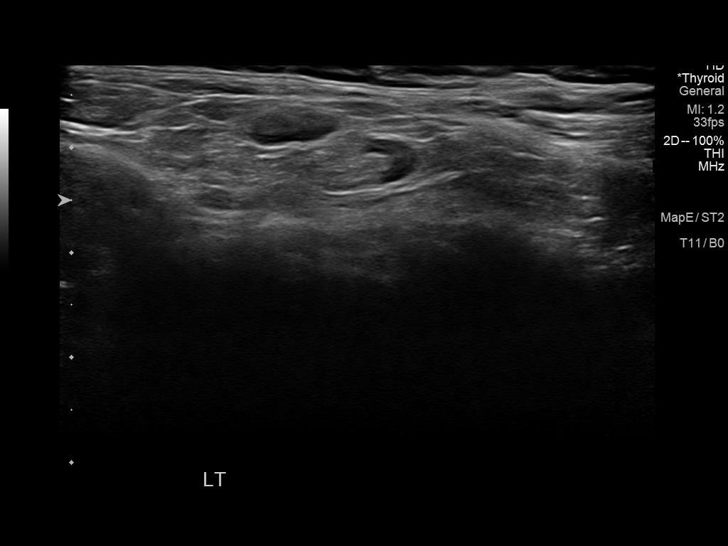
[im 4/13]
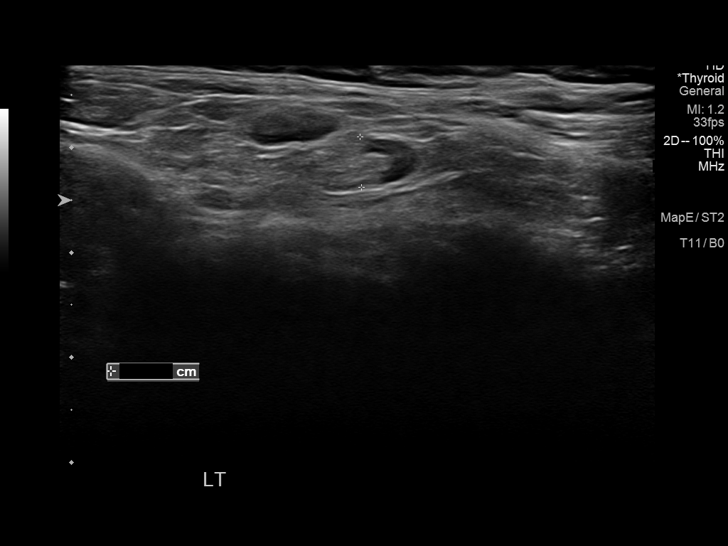
[im 5/13]
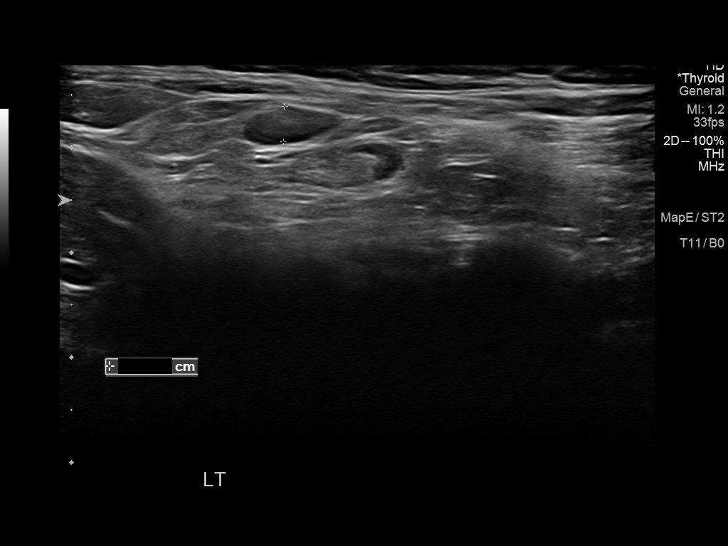
[im 6/13]
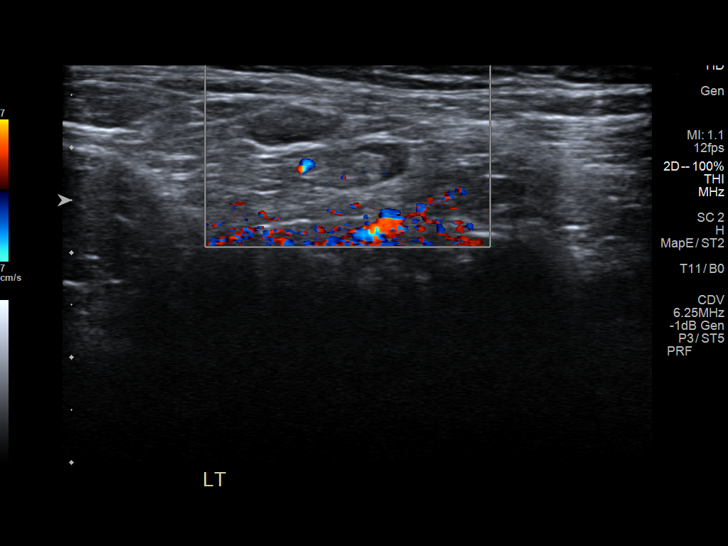
[im 7/13]
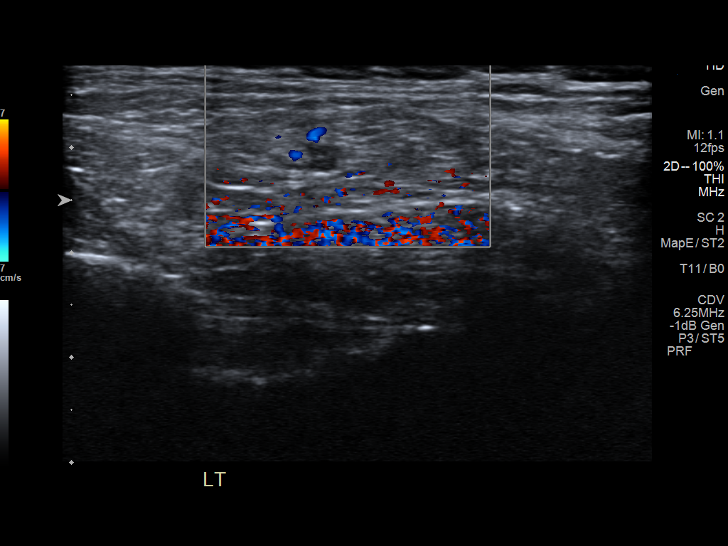
[im 8/13]
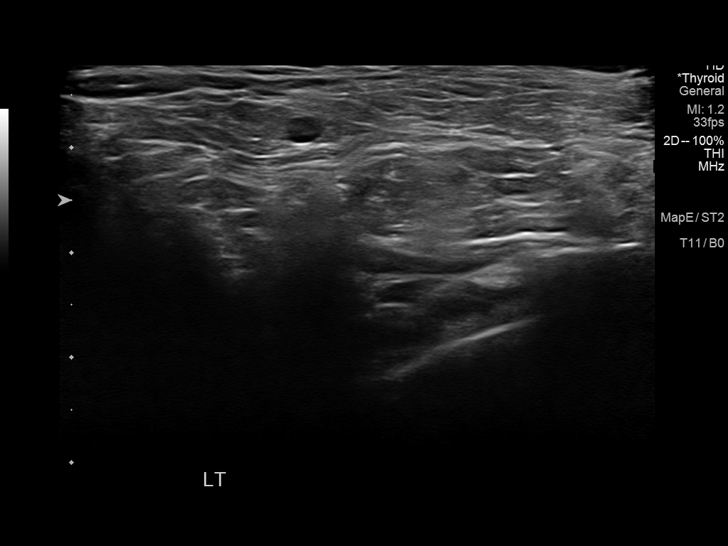
[im 9/13]
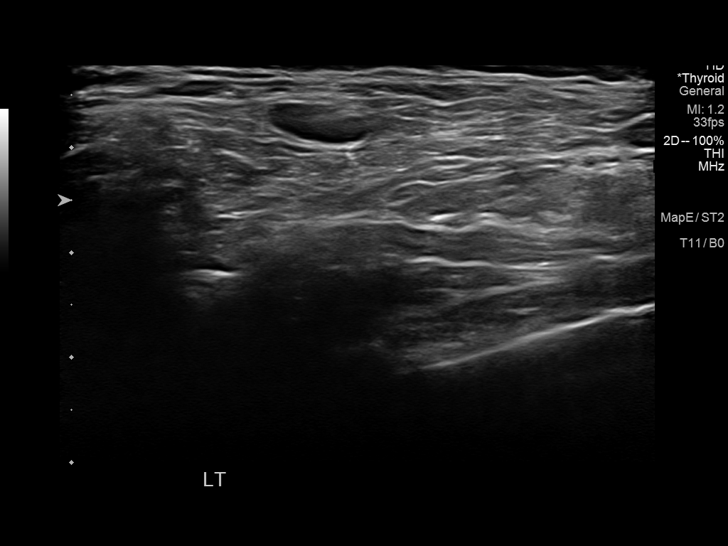
[im 10/13]
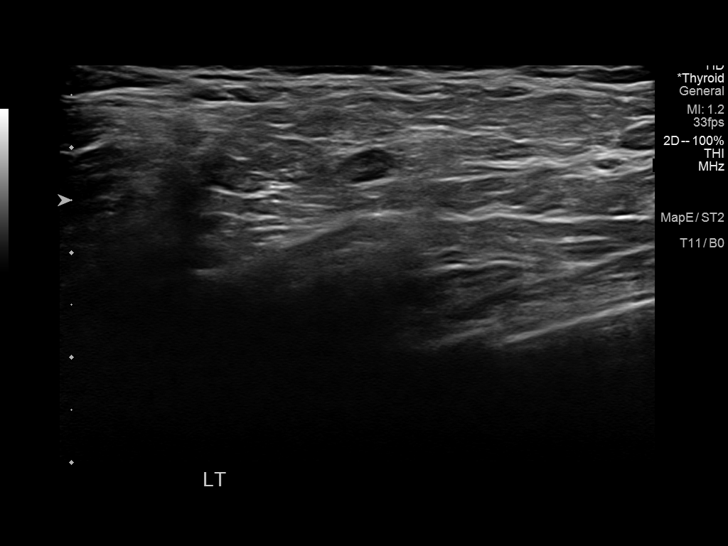
[im 11/13]
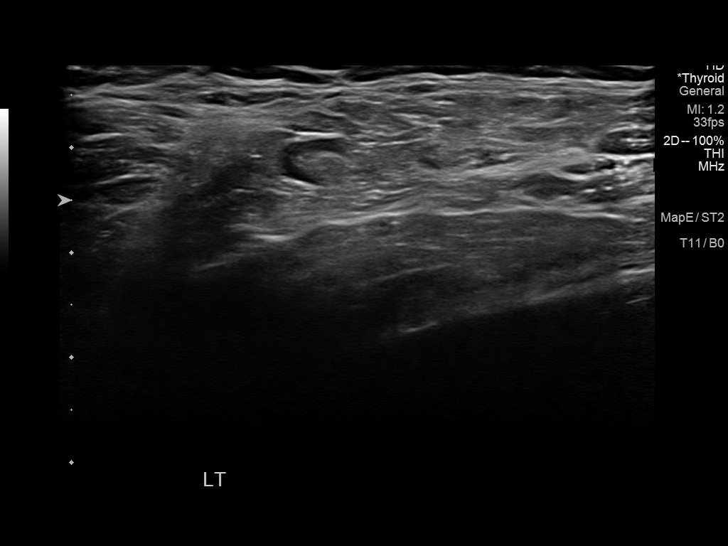
[im 12/13]
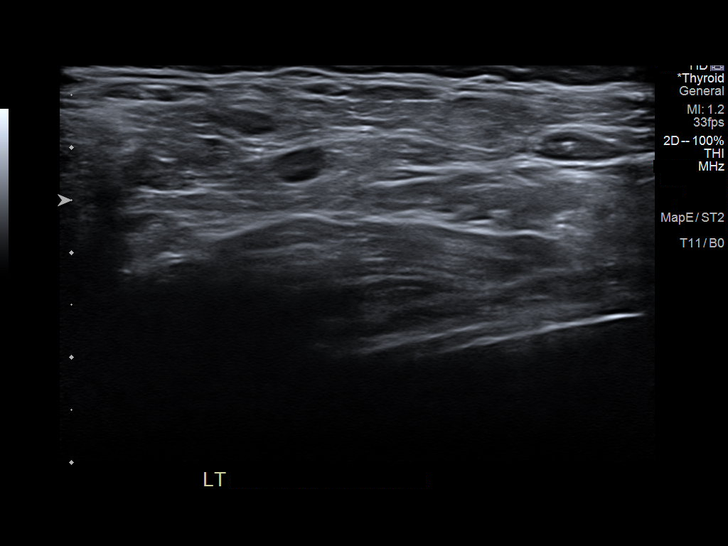
[im 13/13]
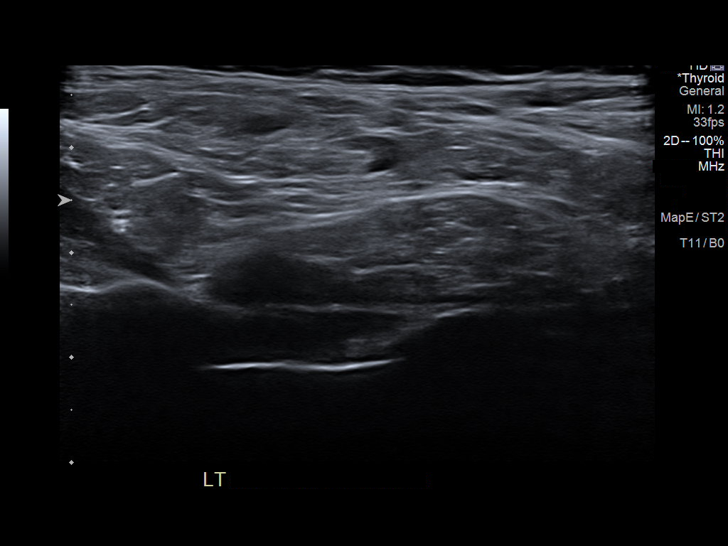

[13 of 13 positions shown; findings below may reference images not displayed]

FINDINGS: Targeted ultrasound of a palpable abnormality of concern at the left
supraclavicular fossa was performed. Ultrasound demonstrates 3
adjacent supraclavicular lymph nodes positioned at this location.
These nodes are within normal limits for size measuring 5 mm in
short axis. Nodes demonstrate a normal architecture with preserved
fatty hilum, with no concerning pathologic features identified.

The surrounding soft tissues are within normal limits. No other
discrete mass, collection, or other abnormality.
IMPRESSION: Palpable abnormality of concern at the left supraclavicular fossa
corresponds with a cluster of 3 normal appearing lymph nodes, all of
which are within normal limits for size measuring no more than 5 mm
in short axis. Findings likely reactive in nature given provided
history of recent COVID vaccination. Clinical follow-up to
resolution recommended.

## 2020-09-26 DIAGNOSIS — O24415 Gestational diabetes mellitus in pregnancy, controlled by oral hypoglycemic drugs: Secondary | ICD-10-CM | POA: Diagnosis not present

## 2020-09-26 DIAGNOSIS — O24414 Gestational diabetes mellitus in pregnancy, insulin controlled: Secondary | ICD-10-CM | POA: Diagnosis not present

## 2020-09-26 DIAGNOSIS — Z3A33 33 weeks gestation of pregnancy: Secondary | ICD-10-CM | POA: Diagnosis not present

## 2020-10-01 ENCOUNTER — Other Ambulatory Visit: Payer: Self-pay | Admitting: Obstetrics & Gynecology

## 2020-10-02 DIAGNOSIS — O24414 Gestational diabetes mellitus in pregnancy, insulin controlled: Secondary | ICD-10-CM | POA: Diagnosis not present

## 2020-10-02 DIAGNOSIS — Z3A34 34 weeks gestation of pregnancy: Secondary | ICD-10-CM | POA: Diagnosis not present

## 2020-10-10 DIAGNOSIS — Z23 Encounter for immunization: Secondary | ICD-10-CM | POA: Diagnosis not present

## 2020-10-10 DIAGNOSIS — Z3A35 35 weeks gestation of pregnancy: Secondary | ICD-10-CM | POA: Diagnosis not present

## 2020-10-10 DIAGNOSIS — Z3689 Encounter for other specified antenatal screening: Secondary | ICD-10-CM | POA: Diagnosis not present

## 2020-10-10 DIAGNOSIS — O24414 Gestational diabetes mellitus in pregnancy, insulin controlled: Secondary | ICD-10-CM | POA: Diagnosis not present

## 2020-10-10 LAB — OB RESULTS CONSOLE GBS: GBS: NEGATIVE

## 2020-10-17 DIAGNOSIS — O24414 Gestational diabetes mellitus in pregnancy, insulin controlled: Secondary | ICD-10-CM | POA: Diagnosis not present

## 2020-10-17 DIAGNOSIS — Z3A36 36 weeks gestation of pregnancy: Secondary | ICD-10-CM | POA: Diagnosis not present

## 2020-10-24 DIAGNOSIS — O24414 Gestational diabetes mellitus in pregnancy, insulin controlled: Secondary | ICD-10-CM | POA: Diagnosis not present

## 2020-10-24 DIAGNOSIS — Z3A37 37 weeks gestation of pregnancy: Secondary | ICD-10-CM | POA: Diagnosis not present

## 2020-10-26 ENCOUNTER — Encounter (HOSPITAL_COMMUNITY): Payer: Self-pay | Admitting: *Deleted

## 2020-10-26 NOTE — Patient Instructions (Addendum)
Alix Lahmann  10/26/2020   Your procedure is scheduled on:  11/05/2020  Arrive at 0800 at Entrance C on [address] at [facility]  and [employer]. You are invited to use the FREE valet parking or use the Visitor's parking deck.  Pick up the phone at the desk and dial [phone].  Call this number if you have problems the morning of surgery: [phone]  Remember:   Do not eat food:(After Midnight) Desps de medianoche.  Do not drink clear liquids: (After Midnight) Desps de medianoche.  Take these medicines the morning of surgery with A SIP OF WATER:  Take half of the insulin you take at bedtime the night before your surgery   Do not wear jewelry, make-up or nail polish.  Do not wear lotions, powders, or perfumes. Do not wear deodorant.  Do not shave 48 hours prior to surgery.  Do not bring valuables to the hospital.  [facility] is not   responsible for any belongings or valuables brought to the hospital.  Contacts, dentures or bridgework may not be worn into surgery.  Leave suitcase in the car. After surgery it may be brought to your room.  For patients admitted to the hospital, checkout time is 11:00 AM the day of              discharge.      Please read over the following fact sheets that you were given:     Preparing for Surgery

## 2020-10-31 ENCOUNTER — Encounter (HOSPITAL_COMMUNITY): Payer: Self-pay | Admitting: Obstetrics & Gynecology

## 2020-10-31 DIAGNOSIS — O24414 Gestational diabetes mellitus in pregnancy, insulin controlled: Secondary | ICD-10-CM | POA: Diagnosis not present

## 2020-10-31 DIAGNOSIS — Z3A38 38 weeks gestation of pregnancy: Secondary | ICD-10-CM | POA: Diagnosis not present

## 2020-11-02 ENCOUNTER — Other Ambulatory Visit: Payer: Self-pay

## 2020-11-02 ENCOUNTER — Other Ambulatory Visit (HOSPITAL_COMMUNITY)
Admission: RE | Admit: 2020-11-02 | Discharge: 2020-11-02 | Disposition: A | Discharge status: Home / Self Care | Payer: BC Managed Care – PPO | Source: Ambulatory Visit | Attending: Obstetrics & Gynecology | Admitting: Obstetrics & Gynecology

## 2020-11-02 ENCOUNTER — Encounter (HOSPITAL_COMMUNITY)
Admission: RE | Admit: 2020-11-02 | Discharge: 2020-11-02 | Disposition: A | Discharge status: Home / Self Care | Payer: BC Managed Care – PPO | Source: Ambulatory Visit | Attending: Obstetrics & Gynecology | Admitting: Obstetrics & Gynecology

## 2020-11-02 ENCOUNTER — Other Ambulatory Visit: Payer: Self-pay | Admitting: Obstetrics & Gynecology

## 2020-11-02 DIAGNOSIS — Z01812 Encounter for preprocedural laboratory examination: Secondary | ICD-10-CM | POA: Insufficient documentation

## 2020-11-02 DIAGNOSIS — Z3A39 39 weeks gestation of pregnancy: Secondary | ICD-10-CM | POA: Diagnosis not present

## 2020-11-02 DIAGNOSIS — O43193 Other malformation of placenta, third trimester: Secondary | ICD-10-CM | POA: Diagnosis not present

## 2020-11-02 DIAGNOSIS — O34211 Maternal care for low transverse scar from previous cesarean delivery: Secondary | ICD-10-CM | POA: Diagnosis not present

## 2020-11-02 DIAGNOSIS — O99214 Obesity complicating childbirth: Secondary | ICD-10-CM | POA: Diagnosis not present

## 2020-11-02 DIAGNOSIS — D62 Acute posthemorrhagic anemia: Secondary | ICD-10-CM | POA: Diagnosis not present

## 2020-11-02 DIAGNOSIS — Z20822 Contact with and (suspected) exposure to covid-19: Secondary | ICD-10-CM | POA: Diagnosis not present

## 2020-11-02 DIAGNOSIS — O43123 Velamentous insertion of umbilical cord, third trimester: Secondary | ICD-10-CM | POA: Diagnosis not present

## 2020-11-02 DIAGNOSIS — O9081 Anemia of the puerperium: Secondary | ICD-10-CM | POA: Diagnosis not present

## 2020-11-02 DIAGNOSIS — O24424 Gestational diabetes mellitus in childbirth, insulin controlled: Secondary | ICD-10-CM | POA: Diagnosis not present

## 2020-11-02 DIAGNOSIS — O34219 Maternal care for unspecified type scar from previous cesarean delivery: Secondary | ICD-10-CM | POA: Diagnosis not present

## 2020-11-02 DIAGNOSIS — Z23 Encounter for immunization: Secondary | ICD-10-CM | POA: Diagnosis not present

## 2020-11-02 DIAGNOSIS — O24425 Gestational diabetes mellitus in childbirth, controlled by oral hypoglycemic drugs: Secondary | ICD-10-CM | POA: Diagnosis not present

## 2020-11-02 DIAGNOSIS — Z0542 Observation and evaluation of newborn for suspected metabolic condition ruled out: Secondary | ICD-10-CM | POA: Diagnosis not present

## 2020-11-02 HISTORY — DX: Gestational diabetes mellitus in pregnancy, unspecified control: O24.419

## 2020-11-02 LAB — CBC
HCT: 36.8 % (ref 36.0–46.0)
Hemoglobin: 12.3 g/dL (ref 12.0–15.0)
MCH: 28.5 pg (ref 26.0–34.0)
MCHC: 33.4 g/dL (ref 30.0–36.0)
MCV: 85.4 fL (ref 80.0–100.0)
Platelets: 161 10*3/uL (ref 150–400)
RBC: 4.31 MIL/uL (ref 3.87–5.11)
RDW: 17.9 % — ABNORMAL HIGH (ref 11.5–15.5)
WBC: 9.8 10*3/uL (ref 4.0–10.5)
nRBC: 0 % (ref 0.0–0.2)

## 2020-11-02 LAB — TYPE AND SCREEN
ABO/RH(D): B POS
Antibody Screen: NEGATIVE

## 2020-11-02 NOTE — Patient Instructions (Addendum)
Jennifer Lee  11/02/2020   Your procedure is scheduled on:  11/05/2020  Arrive at 0800 at Entrance C on [address] at [facility]  and [employer]. You are invited to use the FREE valet parking or use the Visitor's parking deck.  Pick up the phone at the desk and dial [phone].  Call this number if you have problems the morning of surgery: [phone]  Remember:   Do not eat food:(After Midnight) Desps de medianoche.  Do not drink clear liquids: (After Midnight) Desps de medianoche.  Take these medicines the morning of surgery with A SIP OF WATER:  Take half of your nighttime insulin the night before surgery and no medications the day of surgery   Do not wear jewelry, make-up or nail polish.  Do not wear lotions, powders, or perfumes. Do not wear deodorant.  Do not shave 48 hours prior to surgery.  Do not bring valuables to the hospital.  [facility] is not   responsible for any belongings or valuables brought to the hospital.  Contacts, dentures or bridgework may not be worn into surgery.  Leave suitcase in the car. After surgery it may be brought to your room.  For patients admitted to the hospital, checkout time is 11:00 AM the day of              discharge.      Please read over the following fact sheets that you were given:     Preparing for Surgery

## 2020-11-03 ENCOUNTER — Encounter (HOSPITAL_COMMUNITY): Payer: Self-pay | Admitting: Obstetrics & Gynecology

## 2020-11-03 DIAGNOSIS — O34219 Maternal care for unspecified type scar from previous cesarean delivery: Secondary | ICD-10-CM | POA: Diagnosis present

## 2020-11-03 DIAGNOSIS — Z3183 Encounter for assisted reproductive fertility procedure cycle: Secondary | ICD-10-CM

## 2020-11-03 HISTORY — DX: Encounter for assisted reproductive fertility procedure cycle: Z31.83

## 2020-11-03 LAB — RPR: RPR Ser Ql: NONREACTIVE

## 2020-11-03 NOTE — H&P (Addendum)
Jennifer Lee is a 35 y.o. female presenting for Repeat C-section at 39.3 wks PNCare from 7-8 wks, [facility]/ Dr Juliene Pina  IVF, PGS tested normal 64 XX. Cervical length sono nl 16 and 19 wks- wnl A2GDM- diagnosed at 28 wks,  Insulin Lantus 14 u and Metformin 500mg  once daily. Growth AGA. Antesting - reactive NST BPP 8/8 Marginal cord, good growth Obesity H/o one C/section  OB History     Gravida  2   Para  1   Term      Preterm      AB      Living         SAB      IAB      Ectopic      Multiple      Live Births             Past Medical History:  Diagnosis Date   Gestational diabetes    Newborn product of in vitro fertilization (IVF) pregnancy    Past Surgical History:  Procedure Laterality Date   CESAREAN SECTION  2018   Family History: family history is not on file. Social History:  reports that she has never smoked. She has never used smokeless tobacco. She reports that she does not use drugs. No history on file for alcohol use.     Maternal Diabetes: Yes:  Diabetes Type:  Insulin/Medication controlled Genetic Screening: Normal  PGS tested embryo Maternal Ultrasounds/Referrals: Normal and Other: Marginal cord  Fetal Ultrasounds or other Referrals:  None Maternal Substance Abuse:  No Significant Maternal Medications:  Meds include: Other:  IVF meds in 1st trim. Lantus, Metformin from 29 wks  Significant Maternal Lab Results:  Group B Strep negative Other Comments:  None  Review of Systems History   There were no vitals taken for this visit. Exam Physical Exam  Physical exam:  A&O x 3, no acute distress. Pleasant HEENT neg, no thyromegaly Lungs CTA bilat CV RRR, S1S2 normal Abdo soft, non tender, non acute Extr no edema/ tenderness Pelvic cx closed  FHT  130s Toco none  Prenatal labs: ABO, Rh: --/--/B POS (10/28 1038) Antibody: NEG (10/28 1038) Rubella: Immune (04/08 0000) RPR: NON REACTIVE (10/28 1038)  HBsAg: Negative (04/08 0000)   HIV: Non-reactive (04/08 0000)  GBS: Negative/-- (10/05 0000)  PGS XX nl  AFP1 neg 3hr GTT failed   Assessment/Plan: 35 yo G3P1011 39 wks for repeat C/section. NO plans for sterilization.  A2GDM, continue PP checks   Risks/complications of surgery reviewed incl infection, bleeding, damage to internal organs including bladder, bowels, ureters, blood vessels, other risks from anesthesia, VTE and delayed complications of any surgery, complications in future surgery reviewed. Also discussed neonatal complications incl difficult delivery, laceration, vacuum assistance, TTN etc. Pt understands and agrees, all concerns addressed.     34 11/03/2020, 9:15 PM  Addendum-  Chart and H&P reviewed, no changes 11/05/2020 MD

## 2020-11-05 ENCOUNTER — Other Ambulatory Visit: Payer: Self-pay

## 2020-11-05 ENCOUNTER — Inpatient Hospital Stay (HOSPITAL_COMMUNITY): Payer: BC Managed Care – PPO | Admitting: Anesthesiology

## 2020-11-05 ENCOUNTER — Inpatient Hospital Stay (HOSPITAL_COMMUNITY)
Admission: RE | Admit: 2020-11-05 | Discharge: 2020-11-07 | DRG: 787 | Disposition: A | Discharge status: Home / Self Care | Payer: BC Managed Care – PPO | Attending: Obstetrics & Gynecology | Admitting: Obstetrics & Gynecology

## 2020-11-05 ENCOUNTER — Encounter (HOSPITAL_COMMUNITY): Payer: Self-pay | Admitting: Obstetrics & Gynecology

## 2020-11-05 ENCOUNTER — Encounter (HOSPITAL_COMMUNITY)
Admission: RE | Disposition: A | Discharge status: Home / Self Care | Payer: Self-pay | Source: Home / Self Care | Attending: Obstetrics & Gynecology

## 2020-11-05 DIAGNOSIS — O24419 Gestational diabetes mellitus in pregnancy, unspecified control: Secondary | ICD-10-CM | POA: Diagnosis present

## 2020-11-05 DIAGNOSIS — Z3A39 39 weeks gestation of pregnancy: Secondary | ICD-10-CM | POA: Diagnosis not present

## 2020-11-05 DIAGNOSIS — O34211 Maternal care for low transverse scar from previous cesarean delivery: Principal | ICD-10-CM | POA: Diagnosis present

## 2020-11-05 DIAGNOSIS — D62 Acute posthemorrhagic anemia: Secondary | ICD-10-CM | POA: Diagnosis not present

## 2020-11-05 DIAGNOSIS — O9081 Anemia of the puerperium: Secondary | ICD-10-CM | POA: Diagnosis not present

## 2020-11-05 DIAGNOSIS — Z98891 History of uterine scar from previous surgery: Secondary | ICD-10-CM

## 2020-11-05 DIAGNOSIS — Z20822 Contact with and (suspected) exposure to covid-19: Secondary | ICD-10-CM | POA: Diagnosis present

## 2020-11-05 DIAGNOSIS — O99214 Obesity complicating childbirth: Secondary | ICD-10-CM | POA: Diagnosis present

## 2020-11-05 DIAGNOSIS — Z3183 Encounter for assisted reproductive fertility procedure cycle: Secondary | ICD-10-CM

## 2020-11-05 DIAGNOSIS — O24424 Gestational diabetes mellitus in childbirth, insulin controlled: Secondary | ICD-10-CM | POA: Diagnosis present

## 2020-11-05 DIAGNOSIS — O34219 Maternal care for unspecified type scar from previous cesarean delivery: Secondary | ICD-10-CM | POA: Diagnosis present

## 2020-11-05 DIAGNOSIS — O43123 Velamentous insertion of umbilical cord, third trimester: Secondary | ICD-10-CM | POA: Diagnosis present

## 2020-11-05 LAB — GLUCOSE, CAPILLARY
Glucose-Capillary: 87 mg/dL (ref 70–99)
Glucose-Capillary: 71 mg/dL (ref 70–99)
Glucose-Capillary: 102 mg/dL — ABNORMAL HIGH (ref 70–99)

## 2020-11-05 LAB — SARS CORONAVIRUS 2 (TAT 6-24 HRS): SARS Coronavirus 2: NEGATIVE

## 2020-11-05 SURGERY — Surgical Case
Anesthesia: Spinal

## 2020-11-05 MED ORDER — KETOROLAC TROMETHAMINE 30 MG/ML IJ SOLN
30.0000 mg | Freq: Four times a day (QID) | INTRAMUSCULAR | Status: DC | PRN
  Administered 2020-11-05: 30 mg via INTRAMUSCULAR

## 2020-11-05 MED ORDER — SODIUM CHLORIDE 0.9% FLUSH: 3.0000 mL | INTRAVENOUS | Status: DC | PRN

## 2020-11-05 MED ORDER — ACETAMINOPHEN 10 MG/ML IV SOLN
INTRAVENOUS | Status: AC
  Filled 2020-11-05: qty 100

## 2020-11-05 MED ORDER — NALBUPHINE HCL 10 MG/ML IJ SOLN
5.0000 mg | INTRAMUSCULAR | Status: DC | PRN
Start: 2020-11-05 — End: 2020-11-07

## 2020-11-05 MED ORDER — KETOROLAC TROMETHAMINE 30 MG/ML IJ SOLN: 30.0000 mg | Freq: Four times a day (QID) | INTRAMUSCULAR | Status: DC | PRN

## 2020-11-05 MED ORDER — ACETAMINOPHEN 325 MG PO TABS
650.0000 mg | ORAL_TABLET | ORAL | Status: DC | PRN
  Administered 2020-11-06: 650 mg via ORAL
  Filled 2020-11-05: qty 2

## 2020-11-05 MED ORDER — DIPHENHYDRAMINE HCL 50 MG/ML IJ SOLN
12.5000 mg | INTRAMUSCULAR | Status: DC | PRN
  Administered 2020-11-05: 12.5 mg via INTRAVENOUS

## 2020-11-05 MED ORDER — NALBUPHINE HCL 10 MG/ML IJ SOLN: 5.0000 mg | Freq: Once | INTRAMUSCULAR | Status: DC | PRN

## 2020-11-05 MED ORDER — BUPIVACAINE IN DEXTROSE 0.75-8.25 % IT SOLN
INTRATHECAL | Status: DC | PRN
  Administered 2020-11-05: 1.6 mL via INTRATHECAL

## 2020-11-05 MED ORDER — SCOPOLAMINE 1 MG/3DAYS TD PT72: 1.0000 | MEDICATED_PATCH | Freq: Once | TRANSDERMAL | Status: DC

## 2020-11-05 MED ORDER — NALOXONE HCL 4 MG/10ML IJ SOLN
1.0000 ug/kg/h | INTRAVENOUS | Status: DC | PRN
  Filled 2020-11-05: qty 5

## 2020-11-05 MED ORDER — FENTANYL CITRATE (PF) 100 MCG/2ML IJ SOLN
INTRAMUSCULAR | Status: AC
  Filled 2020-11-05: qty 2

## 2020-11-05 MED ORDER — MORPHINE SULFATE (PF) 0.5 MG/ML IJ SOLN
INTRAMUSCULAR | Status: AC
  Filled 2020-11-05: qty 10

## 2020-11-05 MED ORDER — STERILE WATER FOR IRRIGATION IR SOLN
Status: DC | PRN
  Administered 2020-11-05: 1

## 2020-11-05 MED ORDER — DEXAMETHASONE SODIUM PHOSPHATE 10 MG/ML IJ SOLN
INTRAMUSCULAR | Status: AC
  Filled 2020-11-05: qty 1

## 2020-11-05 MED ORDER — SIMETHICONE 80 MG PO CHEW
80.0000 mg | CHEWABLE_TABLET | Freq: Three times a day (TID) | ORAL | Status: DC
  Administered 2020-11-05 – 2020-11-07 (×4): 80 mg via ORAL
  Filled 2020-11-05 (×4): qty 1

## 2020-11-05 MED ORDER — OXYTOCIN-SODIUM CHLORIDE 30-0.9 UT/500ML-% IV SOLN
2.5000 [IU]/h | INTRAVENOUS | Status: AC
  Filled 2020-11-05: qty 500

## 2020-11-05 MED ORDER — FENTANYL CITRATE (PF) 100 MCG/2ML IJ SOLN: 25.0000 ug | INTRAMUSCULAR | Status: DC | PRN

## 2020-11-05 MED ORDER — COCONUT OIL OIL: 1.0000 "application " | TOPICAL_OIL | Status: DC | PRN

## 2020-11-05 MED ORDER — NALOXONE HCL 0.4 MG/ML IJ SOLN: 0.4000 mg | INTRAMUSCULAR | Status: DC | PRN

## 2020-11-05 MED ORDER — KETOROLAC TROMETHAMINE 30 MG/ML IJ SOLN
30.0000 mg | Freq: Once | INTRAMUSCULAR | Status: AC | PRN
  Administered 2020-11-05: 30 mg via INTRAVENOUS

## 2020-11-05 MED ORDER — OXYTOCIN-SODIUM CHLORIDE 30-0.9 UT/500ML-% IV SOLN
INTRAVENOUS | Status: AC
  Filled 2020-11-05: qty 500

## 2020-11-05 MED ORDER — PHENYLEPHRINE HCL-NACL 20-0.9 MG/250ML-% IV SOLN
INTRAVENOUS | Status: AC
  Filled 2020-11-05: qty 250

## 2020-11-05 MED ORDER — SENNOSIDES-DOCUSATE SODIUM 8.6-50 MG PO TABS
2.0000 | ORAL_TABLET | Freq: Every day | ORAL | Status: DC
  Administered 2020-11-06 – 2020-11-07 (×2): 2 via ORAL
  Filled 2020-11-05 (×2): qty 2

## 2020-11-05 MED ORDER — WITCH HAZEL-GLYCERIN EX PADS: 1.0000 "application " | MEDICATED_PAD | CUTANEOUS | Status: DC | PRN

## 2020-11-05 MED ORDER — CEFAZOLIN SODIUM-DEXTROSE 2-4 GM/100ML-% IV SOLN
INTRAVENOUS | Status: AC
  Filled 2020-11-05: qty 100

## 2020-11-05 MED ORDER — POVIDONE-IODINE 10 % EX SWAB
2.0000 "application " | Freq: Once | CUTANEOUS | Status: AC
  Administered 2020-11-05: 2 via TOPICAL

## 2020-11-05 MED ORDER — DIPHENHYDRAMINE HCL 50 MG/ML IJ SOLN
INTRAMUSCULAR | Status: AC
  Filled 2020-11-05: qty 1

## 2020-11-05 MED ORDER — PRENATAL MULTIVITAMIN CH
1.0000 | ORAL_TABLET | Freq: Every day | ORAL | Status: DC
  Administered 2020-11-06 – 2020-11-07 (×2): 1 via ORAL
  Filled 2020-11-05 (×2): qty 1

## 2020-11-05 MED ORDER — OXYTOCIN-SODIUM CHLORIDE 30-0.9 UT/500ML-% IV SOLN
INTRAVENOUS | Status: DC | PRN
  Administered 2020-11-05: 300 mL via INTRAVENOUS

## 2020-11-05 MED ORDER — DIPHENHYDRAMINE HCL 25 MG PO CAPS: 25.0000 mg | ORAL_CAPSULE | ORAL | Status: DC | PRN

## 2020-11-05 MED ORDER — TETANUS-DIPHTH-ACELL PERTUSSIS 5-2.5-18.5 LF-MCG/0.5 IM SUSY: 0.5000 mL | PREFILLED_SYRINGE | Freq: Once | INTRAMUSCULAR | Status: DC

## 2020-11-05 MED ORDER — KETOROLAC TROMETHAMINE 30 MG/ML IJ SOLN
30.0000 mg | Freq: Four times a day (QID) | INTRAMUSCULAR | Status: AC
  Administered 2020-11-05 – 2020-11-06 (×2): 30 mg via INTRAVENOUS
  Filled 2020-11-05 (×2): qty 1

## 2020-11-05 MED ORDER — CEFAZOLIN SODIUM-DEXTROSE 2-4 GM/100ML-% IV SOLN
2.0000 g | INTRAVENOUS | Status: AC
  Administered 2020-11-05: 2 g via INTRAVENOUS

## 2020-11-05 MED ORDER — MENTHOL 3 MG MT LOZG: 1.0000 | LOZENGE | OROMUCOSAL | Status: DC | PRN

## 2020-11-05 MED ORDER — DIPHENHYDRAMINE HCL 25 MG PO CAPS: 25.0000 mg | ORAL_CAPSULE | Freq: Four times a day (QID) | ORAL | Status: DC | PRN

## 2020-11-05 MED ORDER — SIMETHICONE 80 MG PO CHEW: 80.0000 mg | CHEWABLE_TABLET | ORAL | Status: DC | PRN

## 2020-11-05 MED ORDER — NALBUPHINE HCL 10 MG/ML IJ SOLN
5.0000 mg | Freq: Once | INTRAMUSCULAR | Status: DC | PRN
Start: 2020-11-05 — End: 2020-11-07

## 2020-11-05 MED ORDER — ONDANSETRON HCL 4 MG/2ML IJ SOLN: 4.0000 mg | Freq: Three times a day (TID) | INTRAMUSCULAR | Status: DC | PRN

## 2020-11-05 MED ORDER — KETOROLAC TROMETHAMINE 30 MG/ML IJ SOLN
INTRAMUSCULAR | Status: AC
  Filled 2020-11-05: qty 1

## 2020-11-05 MED ORDER — ONDANSETRON HCL 4 MG/2ML IJ SOLN
INTRAMUSCULAR | Status: AC
  Filled 2020-11-05: qty 2

## 2020-11-05 MED ORDER — FENTANYL CITRATE (PF) 100 MCG/2ML IJ SOLN
INTRAMUSCULAR | Status: DC | PRN
  Administered 2020-11-05: 15 ug via INTRATHECAL

## 2020-11-05 MED ORDER — OXYCODONE HCL 5 MG PO TABS
5.0000 mg | ORAL_TABLET | Freq: Four times a day (QID) | ORAL | Status: DC | PRN
Start: 2020-11-05 — End: 2020-11-07
  Administered 2020-11-06: 5 mg via ORAL
  Filled 2020-11-05: qty 1

## 2020-11-05 MED ORDER — DIBUCAINE (PERIANAL) 1 % EX OINT: 1.0000 "application " | TOPICAL_OINTMENT | CUTANEOUS | Status: DC | PRN

## 2020-11-05 MED ORDER — ZOLPIDEM TARTRATE 5 MG PO TABS: 5.0000 mg | ORAL_TABLET | Freq: Every evening | ORAL | Status: DC | PRN

## 2020-11-05 MED ORDER — PHENYLEPHRINE HCL-NACL 20-0.9 MG/250ML-% IV SOLN
INTRAVENOUS | Status: DC | PRN
Start: 2020-11-05 — End: 2020-11-05
  Administered 2020-11-05: 60 ug/min via INTRAVENOUS

## 2020-11-05 MED ORDER — IBUPROFEN 600 MG PO TABS
600.0000 mg | ORAL_TABLET | Freq: Four times a day (QID) | ORAL | Status: DC
  Administered 2020-11-06 – 2020-11-07 (×5): 600 mg via ORAL
  Filled 2020-11-05 (×5): qty 1

## 2020-11-05 MED ORDER — MORPHINE SULFATE (PF) 0.5 MG/ML IJ SOLN
INTRAMUSCULAR | Status: DC | PRN
  Administered 2020-11-05: .15 mg via INTRATHECAL

## 2020-11-05 MED ORDER — LACTATED RINGERS IV SOLN: INTRAVENOUS | Status: DC

## 2020-11-05 MED ORDER — ACETAMINOPHEN 500 MG PO TABS
1000.0000 mg | ORAL_TABLET | Freq: Four times a day (QID) | ORAL | Status: AC
  Administered 2020-11-05 – 2020-11-06 (×3): 1000 mg via ORAL
  Filled 2020-11-05 (×4): qty 2

## 2020-11-05 MED ORDER — DEXAMETHASONE SODIUM PHOSPHATE 10 MG/ML IJ SOLN
INTRAMUSCULAR | Status: DC | PRN
  Administered 2020-11-05: 8 mg via INTRAVENOUS

## 2020-11-05 MED ORDER — ONDANSETRON HCL 4 MG/2ML IJ SOLN
INTRAMUSCULAR | Status: DC | PRN
  Administered 2020-11-05: 4 mg via INTRAVENOUS

## 2020-11-05 SURGICAL SUPPLY — 37 items
BENZOIN TINCTURE PRP APPL 2/3 (GAUZE/BANDAGES/DRESSINGS) ×2 IMPLANT
CHLORAPREP W/TINT 26ML (MISCELLANEOUS) ×2 IMPLANT
CLAMP CORD UMBIL (MISCELLANEOUS) IMPLANT
CLOTH BEACON ORANGE TIMEOUT ST (SAFETY) ×2 IMPLANT
DRESSING PREVENA PLUS CUSTOM (GAUZE/BANDAGES/DRESSINGS) ×1 IMPLANT
DRSG OPSITE POSTOP 4X10 (GAUZE/BANDAGES/DRESSINGS) ×2 IMPLANT
DRSG PREVENA PLUS CUSTOM (GAUZE/BANDAGES/DRESSINGS) ×2
ELECT REM PT RETURN 9FT ADLT (ELECTROSURGICAL) ×2
ELECTRODE REM PT RTRN 9FT ADLT (ELECTROSURGICAL) ×1 IMPLANT
EXTRACTOR VACUUM KIWI (MISCELLANEOUS) IMPLANT
EXTRACTOR VACUUM M CUP 4 TUBE (SUCTIONS) IMPLANT
GLOVE BIO SURGEON STRL SZ7 (GLOVE) ×2 IMPLANT
GLOVE BIOGEL PI IND STRL 7.0 (GLOVE) ×3 IMPLANT
GLOVE BIOGEL PI INDICATOR 7.0 (GLOVE) ×3
GOWN STRL REUS W/TWL LRG LVL3 (GOWN DISPOSABLE) ×6 IMPLANT
KIT ABG SYR 3ML LUER SLIP (SYRINGE) IMPLANT
NEEDLE HYPO 25X5/8 SAFETYGLIDE (NEEDLE) IMPLANT
NS IRRIG 1000ML POUR BTL (IV SOLUTION) ×2 IMPLANT
PACK C SECTION WH (CUSTOM PROCEDURE TRAY) ×2 IMPLANT
PAD ABD DERMACEA PRESS 5X9 (GAUZE/BANDAGES/DRESSINGS) ×2 IMPLANT
PAD OB MATERNITY 4.3X12.25 (PERSONAL CARE ITEMS) ×2 IMPLANT
RTRCTR C-SECT PINK 25CM LRG (MISCELLANEOUS) IMPLANT
SPONGE GAUZE 4X4 12PLY STER LF (GAUZE/BANDAGES/DRESSINGS) ×4 IMPLANT
STRIP CLOSURE SKIN 1/2X4 (GAUZE/BANDAGES/DRESSINGS) ×2 IMPLANT
SUT MNCRL 0 VIOLET CTX 36 (SUTURE) ×2 IMPLANT
SUT MONOCRYL 0 CTX 36 (SUTURE) ×2
SUT PLAIN 0 NONE (SUTURE) IMPLANT
SUT PLAIN 2 0 (SUTURE)
SUT PLAIN ABS 2-0 CT1 27XMFL (SUTURE) IMPLANT
SUT VIC AB 0 CT1 27 (SUTURE) ×3
SUT VIC AB 0 CT1 27XBRD ANBCTR (SUTURE) ×3 IMPLANT
SUT VIC AB 2-0 CT1 27 (SUTURE) ×1
SUT VIC AB 2-0 CT1 TAPERPNT 27 (SUTURE) ×1 IMPLANT
SUT VIC AB 4-0 KS 27 (SUTURE) ×2 IMPLANT
TOWEL OR 17X24 6PK STRL BLUE (TOWEL DISPOSABLE) ×2 IMPLANT
TRAY FOLEY W/BAG SLVR 14FR LF (SET/KITS/TRAYS/PACK) IMPLANT
WATER STERILE IRR 1000ML POUR (IV SOLUTION) ×2 IMPLANT

## 2020-11-05 NOTE — Transfer of Care (Signed)
Immediate Anesthesia Transfer of Care Note  Patient: Jennifer Lee  Procedure(s) Performed: CESAREAN SECTION WITH BILATERAL TUBAL LIGATION  Patient Location: PACU  Anesthesia Type:Spinal  Level of Consciousness: awake  Airway & Oxygen Therapy: Patient Spontanous Breathing  Post-op Assessment: Report given to RN and Post -op Vital signs reviewed and stable  Post vital signs: Reviewed and stable  Last Vitals:  Vitals Value Taken Time  BP 113/69 11/05/20 1304  Temp    Pulse 86 11/05/20 1308  Resp 17 11/05/20 1308  SpO2 100 % 11/05/20 1308  Vitals shown include unvalidated device data.  Last Pain:  Vitals:   11/05/20 0825  TempSrc: Oral         Complications: No notable events documented.

## 2020-11-05 NOTE — Progress Notes (Signed)
RN called Covid testing site to verify patient Covid test was negative. Testing site attempting to enter negative result into patient's chart.

## 2020-11-05 NOTE — Anesthesia Procedure Notes (Signed)
Spinal  Patient location during procedure: OR Start time: 11/05/2020 11:46 AM End time: 11/05/2020 11:49 AM Reason for block: surgical anesthesia Staffing Performed: anesthesiologist  Anesthesiologist: Elmer Picker, MD Preanesthetic Checklist Completed: patient identified, IV checked, risks and benefits discussed, surgical consent, monitors and equipment checked, pre-op evaluation and timeout performed Spinal Block Patient position: sitting Prep: DuraPrep and site prepped and draped Patient monitoring: cardiac monitor, continuous pulse ox and blood pressure Approach: midline Location: L3-4 Injection technique: single-shot Needle Needle type: Pencan  Needle gauge: 24 G Needle length: 9 cm Assessment Sensory level: T6 Events: CSF return Additional Notes Functioning IV was confirmed and monitors were applied. Sterile prep and drape, including hand hygiene and sterile gloves were used. The patient was positioned and the spine was prepped. The skin was anesthetized with lidocaine.  Free flow of clear CSF was obtained prior to injecting local anesthetic into the CSF.  The spinal needle aspirated freely following injection.  The needle was carefully withdrawn.  The patient tolerated the procedure well.

## 2020-11-05 NOTE — Anesthesia Postprocedure Evaluation (Signed)
Anesthesia Post Note  Patient: Jennifer Lee  Procedure(s) Performed: CESAREAN SECTION WITH BILATERAL TUBAL LIGATION     Patient location during evaluation: PACU Anesthesia Type: Spinal Level of consciousness: oriented and awake and alert Pain management: pain level controlled Vital Signs Assessment: post-procedure vital signs reviewed and stable Respiratory status: spontaneous breathing, respiratory function stable and patient connected to nasal cannula oxygen Cardiovascular status: blood pressure returned to baseline and stable Postop Assessment: no headache, no backache and no apparent nausea or vomiting Anesthetic complications: no   No notable events documented.  Last Vitals:  Vitals:   11/05/20 1713 11/05/20 1823  BP: 110/69 120/74  Pulse: 68 75  Resp: 20 18  Temp: 36.8 C 36.9 C  SpO2: 98% 100%    Last Pain:  Vitals:   11/05/20 1823  TempSrc:   PainSc: 0-No pain   Pain Goal:                Epidural/Spinal Function Cutaneous sensation: Normal sensation (11/05/20 1823)  Marck Mcclenny L Jerolyn Flenniken

## 2020-11-05 NOTE — Lactation Note (Signed)
This note was copied from a baby's chart. Lactation Consultation Note  Patient Name: Jennifer Lee [id number] Date: 11/05/2020 Reason for consult: Initial assessment;Mother's request;Term;Breastfeeding assistance;Maternal endocrine disorder (IVF) Age:33 hours Mom on Insulin.    LC assisted with latching infant in football position with increase in depth of swallow noted with breast compression.    Plan 1. To feed based on cues 8-12x 24 hr period. Mom to offer breasts and look for signs of milk transfer.  2. If unable to latch at breast, Mom to offer EBM via spoon.  3. I and O sheet reviewed.  Mom has an electric pump at home.  All questions answered at the end of the visit.  Maternal Data Has patient been taught Hand Expression?: Yes Does the patient have breastfeeding experience prior to this delivery?: Yes How long did the patient breastfeed?: pumped and bottle fed, infant not able to latch  Feeding Mother's Current Feeding Choice: Breast Milk  LATCH Score Latch: Repeated attempts needed to sustain latch, nipple held in mouth throughout feeding, stimulation needed to elicit sucking reflex.  Audible Swallowing: Spontaneous and intermittent  Type of Nipple: Everted at rest and after stimulation  Comfort (Breast/Nipple): Soft / non-tender  Hold (Positioning): Assistance needed to correctly position infant at breast and maintain latch.  LATCH Score: 8   Lactation Tools Discussed/Used    Interventions Interventions: Breast feeding basics reviewed;Support pillows;Education;Assisted with latch;Position options;Skin to skin;Expressed milk;Breast massage;Hand express;LC Services brochure;Breast compression;Infant Driven Feeding Algorithm education;Adjust position  Discharge Pump: Personal  Consult Status Consult Status: Follow-up Date: 11/06/20 Follow-up type: In-patient    Jennifer Laskin  Lee 11/05/2020, 4:51 PM

## 2020-11-05 NOTE — Anesthesia Preprocedure Evaluation (Signed)
Anesthesia Evaluation  Patient identified by MRN, date of birth, ID band Patient awake    Reviewed: Allergy & Precautions, NPO status , Patient's Chart, lab work & pertinent test results  Airway Mallampati: II  TM Distance: >3 FB Neck ROM: Full    Dental no notable dental hx.    Pulmonary neg pulmonary ROS,    Pulmonary exam normal breath sounds clear to auscultation       Cardiovascular negative cardio ROS Normal cardiovascular exam Rhythm:Regular Rate:Normal     Neuro/Psych negative neurological ROS  negative psych ROS   GI/Hepatic negative GI ROS, Neg liver ROS,   Endo/Other  diabetes, Gestational, Insulin Dependent, Oral Hypoglycemic Agents  Renal/GU negative Renal ROS  negative genitourinary   Musculoskeletal negative musculoskeletal ROS (+)   Abdominal   Peds  Hematology negative hematology ROS (+)   Anesthesia Other Findings Repeat C/Sx1  Reproductive/Obstetrics (+) Pregnancy                             Anesthesia Physical Anesthesia Plan  ASA: 3  Anesthesia Plan: Spinal   Post-op Pain Management:    Induction:   PONV Risk Score and Plan: Treatment may vary due to age or medical condition  Airway Management Planned: Natural Airway  Additional Equipment:   Intra-op Plan:   Post-operative Plan:   Informed Consent: I have reviewed the patients History and Physical, chart, labs and discussed the procedure including the risks, benefits and alternatives for the proposed anesthesia with the patient or authorized representative who has indicated his/her understanding and acceptance.     Dental advisory given  Plan Discussed with: CRNA  Anesthesia Plan Comments:         Anesthesia Quick Evaluation

## 2020-11-05 NOTE — Op Note (Addendum)
Case delayed due to other emergencies in OR. patient advised.   Cesarean Section Procedure Note   Jennifer Lee  11/05/2020 Procedure: Repeat Low Transverse Cesarean section  Indications: Scheduled Proceedure/Maternal Request One C/section. IVF pregnancy, A2GDM. Marginal cord 39.3 weeks. Declined sterilization at last visit in office   Pre-operative Diagnosis: Previous Cesarean Section, In-Vitro Fertilization, Marginal Cord   Post-operative Diagnosis: Same   Surgeon: Shea Evans, MD    Assistants: Carlean Jews, CNM   Anesthesia: spinal   Procedure Details:  The patient was seen in the Holding Room. The risks, benefits, complications, treatment options, and expected outcomes were discussed with the patient. The patient concurred with the proposed plan, giving informed consent. identified as [name] and the procedure verified as C-Section Delivery.  A Time Out was held and the above information confirmed. 2 gm Ancef given. After induction of spinal anesthesia, the patient was draped and prepped in the usual sterile manner, foley was draining urine well.  A pfannenstiel incision was made and carried down through the subcutaneous tissue to the fascia. Fascial incision was made and extended transversely. The fascia was separated from the underlying rectus tissue superiorly and inferiorly. The peritoneum was identified and entered. Peritoneal incision was extended longitudinally. Alexis-O retractor placed. The utero-vesical peritoneal reflection was assessed but not incised separately. A low transverse uterine incision was made. Clear small amount of amniotic fluid drained. Delivered from cephalic presentation was a Female infant with vigorous cry. Apgar scores of 9 at one minute and 9 at five minutes. Delayed cord clamping done at 1 minute and baby handed to NICU team in attendance. Cord ph was not sent. Cord blood was obtained for evaluation. The placenta was removed Intact and appeared  normal. The uterine outline, tubes and ovaries appeared normal}. The uterine incision was closed with running locked sutures of 0 Monocryl. A second imbricating layer sutured.  Hemostasis was observed. Alexis retractor removed. Peritoneal closure done with 2-0 Vicryl.  The fascia was then reapproximated with running sutures of 0Vicryl. The subcuticular closure was performed using 2-0plain gut. The skin was closed with 4-0Vicryl.   Instrument, sponge, and needle counts were correct prior the abdominal closure and were correct at the conclusion of the case.   Findings: Girl. Kerr hysterotomy. Lower segment fibroid on right anterior lower segment. Normal tubes and ovaries.    Estimated Blood Loss: 554 cc    Total IV Fluids: 2 lit    Urine Output:  150 CC OF clear urine  Specimens: Cod blood   Complications: no complications  Disposition: PACU - hemodynamically stable.   Maternal Condition: stable   Baby condition / location:  Couplet care / Skin to Skin  Attending Attestation: I performed the procedure.   Signed: Surgeon(s): Shea Evans, MD

## 2020-11-06 LAB — CBC
HCT: 29.5 % — ABNORMAL LOW (ref 36.0–46.0)
Hemoglobin: 9.9 g/dL — ABNORMAL LOW (ref 12.0–15.0)
MCH: 28.4 pg (ref 26.0–34.0)
MCHC: 33.6 g/dL (ref 30.0–36.0)
MCV: 84.8 fL (ref 80.0–100.0)
Platelets: 142 10*3/uL — ABNORMAL LOW (ref 150–400)
RBC: 3.48 MIL/uL — ABNORMAL LOW (ref 3.87–5.11)
RDW: 17.2 % — ABNORMAL HIGH (ref 11.5–15.5)
WBC: 13.2 10*3/uL — ABNORMAL HIGH (ref 4.0–10.5)
nRBC: 0 % (ref 0.0–0.2)

## 2020-11-06 LAB — GLUCOSE, CAPILLARY
Glucose-Capillary: 98 mg/dL (ref 70–99)
Glucose-Capillary: 77 mg/dL (ref 70–99)
Glucose-Capillary: 96 mg/dL (ref 70–99)
Glucose-Capillary: 99 mg/dL (ref 70–99)

## 2020-11-06 MED ORDER — POLYSACCHARIDE IRON COMPLEX 150 MG PO CAPS
150.0000 mg | ORAL_CAPSULE | Freq: Every day | ORAL | Status: DC
  Administered 2020-11-07: 150 mg via ORAL
  Filled 2020-11-06: qty 1

## 2020-11-06 MED ORDER — LACTATED RINGERS IV SOLN: INTRAVENOUS | Status: DC

## 2020-11-06 MED ORDER — LACTATED RINGERS IV BOLUS
300.0000 mL | Freq: Once | INTRAVENOUS | Status: AC
  Administered 2020-11-06: 300 mL via INTRAVENOUS

## 2020-11-06 MED ORDER — MAGNESIUM OXIDE -MG SUPPLEMENT 400 (240 MG) MG PO TABS
400.0000 mg | ORAL_TABLET | Freq: Every day | ORAL | Status: DC
  Administered 2020-11-07: 400 mg via ORAL
  Filled 2020-11-06: qty 1

## 2020-11-06 NOTE — Progress Notes (Signed)
Assessment performed at 0830

## 2020-11-06 NOTE — Progress Notes (Addendum)
Pod  1  S:  Pt reports feeling well/ Tolerating po/ Voiding without problems/ No n/v/ Bleeding is spotting/ Pain controlled withacetaminophen and ibuprofen (OTC)   O:  A & O x 3 na/ VS: Blood pressure 116/70, pulse 86, temperature 97.8 F (36.6 C), temperature source Oral, resp. rate 18, height 5\' 3"  (1.6 m), weight 86.6 kg, SpO2 100 %.  LABS:   I&O: I/O last 3 completed shifts: In: 1460 [P.O.:360; I.V.:1000; IV Piggyback:100] Out: 1475 [Urine:925; Blood:550]   Total I/O In: -  Out: 300 [Urine:300]  CBC    Component Value Date/Time   WBC 13.2 (H) 11/06/2020 0509   RBC 3.48 (L) 11/06/2020 0509   HGB 9.9 (L) 11/06/2020 0509   HCT 29.5 (L) 11/06/2020 0509   PLT 142 (L) 11/06/2020 0509   MCV 84.8 11/06/2020 0509   MCH 28.4 11/06/2020 0509   MCHC 33.6 11/06/2020 0509   RDW 17.2 (H) 11/06/2020 0509     Lungs: cta  Heart: rrr  Abdomen: soft NT, Incision dsgi intact  Perineum: intact  Lochia: wnl  Extremities: neg c/c/e    A/P: PoD # 1- rpt csection            Anemia- start fe            A2DM- BS well controlled ,off meds  Doing well  Continue routine post partum orders

## 2020-11-06 NOTE — Plan of Care (Signed)
  Problem: Education: Goal: Knowledge of General Education information will improve Description: Including pain rating scale, medication(s)/side effects and non-pharmacologic comfort measures Outcome: Completed/Met   Problem: Clinical Measurements: Goal: Will remain free from infection Outcome: Completed/Met Goal: Diagnostic test results will improve Outcome: Completed/Met Goal: Cardiovascular complication will be avoided Outcome: Completed/Met   Problem: Activity: Goal: Risk for activity intolerance will decrease Outcome: Completed/Met   Problem: Nutrition: Goal: Adequate nutrition will be maintained Outcome: Completed/Met   Problem: Coping: Goal: Level of anxiety will decrease Outcome: Completed/Met   Problem: Elimination: Goal: Will not experience complications related to urinary retention Outcome: Completed/Met   Problem: Pain Managment: Goal: General experience of comfort will improve Outcome: Completed/Met   Problem: Safety: Goal: Ability to remain free from injury will improve Outcome: Completed/Met   Problem: Skin Integrity: Goal: Risk for impaired skin integrity will decrease Outcome: Completed/Met   Problem: Education: Goal: Knowledge of condition will improve Outcome: Completed/Met   Problem: Activity: Goal: Will verbalize the importance of balancing activity with adequate rest periods Outcome: Completed/Met Goal: Ability to tolerate increased activity will improve Outcome: Completed/Met   Problem: Coping: Goal: Ability to identify and utilize available resources and services will improve Outcome: Completed/Met   Problem: Life Cycle: Goal: Chance of risk for complications during the postpartum period will decrease Outcome: Completed/Met   Problem: Role Relationship: Goal: Ability to demonstrate positive interaction with newborn will improve Outcome: Completed/Met   Problem: Skin Integrity: Goal: Demonstration of wound healing without  infection will improve Outcome: Completed/Met

## 2020-11-06 NOTE — Lactation Note (Signed)
This note was copied from a baby's chart. Lactation Consultation Note  Patient Name: Jennifer Lee BTCYE'L Date: 11/06/2020 Reason for consult: RN request;Follow-up assessment Age:33 hours  Infant alert and moving hands to mouth while  in bassinet.  Mom states she just fed.  LC reviewed cluster feeding and watching for baby's feeding cues.    LC offered to assist with latch because mom states she feels infant is not latching fully to the nipple.  Mom declined.  LC encouraged mom to call out if she desires assistance.   Maternal Data    Feeding Mother's Current Feeding Choice: Breast Milk  LATCH Score Latch: Repeated attempts needed to sustain latch, nipple held in mouth throughout feeding, stimulation needed to elicit sucking reflex.  Audible Swallowing: A few with stimulation  Type of Nipple: Everted at rest and after stimulation  Comfort (Breast/Nipple): Soft / non-tender  Hold (Positioning): Assistance needed to correctly position infant at breast and maintain latch.  LATCH Score: 7   Lactation Tools Discussed/Used    Interventions Interventions: Breast feeding basics reviewed;Education  Discharge    Consult Status      [name] [facility] 11/06/2020, 12:04 PM

## 2020-11-07 LAB — GLUCOSE, CAPILLARY
Glucose-Capillary: 85 mg/dL (ref 70–99)
Glucose-Capillary: 136 mg/dL — ABNORMAL HIGH (ref 70–99)
Glucose-Capillary: 83 mg/dL (ref 70–99)

## 2020-11-07 MED ORDER — OXYCODONE HCL 5 MG PO TABS: 5.0000 mg | ORAL_TABLET | Freq: Four times a day (QID) | ORAL | 0 refills | Status: AC | PRN

## 2020-11-07 MED ORDER — POLYSACCHARIDE IRON COMPLEX 150 MG PO CAPS: 150.0000 mg | ORAL_CAPSULE | Freq: Every day | ORAL | 3 refills | Status: AC

## 2020-11-07 MED ORDER — IBUPROFEN 600 MG PO TABS: 600.0000 mg | ORAL_TABLET | Freq: Four times a day (QID) | ORAL | 0 refills | Status: AC

## 2020-11-07 NOTE — Progress Notes (Addendum)
Pt post prandial blood sugar 136. Pt verbalized she ate oatmeal one hour ago. Will recheck pt post prandial blood sugar at 1130. Will continue to monitor pt.

## 2020-11-07 NOTE — Discharge Summary (Signed)
Postpartum Discharge Summary  Date of Service updated     Patient Name: Jennifer Lee DOB: 1985-12-13 MRN: [id number]  Date of admission: 11/05/2020 Delivery date:11/05/2020  Delivering provider: MODY, VAISHALI  Date of discharge: 11/07/2020  Admitting diagnosis: Previous cesarean section [Z98.891] Status post repeat low transverse cesarean section [Z98.891] Intrauterine pregnancy: [redacted]w[redacted]d     Secondary diagnosis:  Principal Problem:   Postpartum care following cesarean delivery 10/31 Active Problems:   Gestational diabetes mellitus (GDM), antepartum   In vitro fertilization   History of cesarean delivery affecting pregnancy   Previous cesarean section   Status post repeat low transverse cesarean section  Additional problems: anemia    Discharge diagnosis: Term Pregnancy Delivered and GDM A2                                              Post partum procedures: none Augmentation: N/A Complications: None  Hospital course: Sceduled C/S   35 y.o. yo G2P1 at [redacted]w[redacted]d was admitted to the hospital 11/05/2020 for scheduled cesarean section with the following indication:Elective Repeat.Delivery details are as follows:  Membrane Rupture Time/Date: 12:14 PM ,11/05/2020   Delivery Method:C-Section, Low Transverse  Details of operation can be found in separate operative note.  Patient had an uncomplicated postpartum course.  She is ambulating, tolerating a regular diet, passing flatus, and urinating well. Patient is discharged home in stable condition on  11/07/20        Newborn Data: Birth date:11/05/2020  Birth time:12:15 PM  Gender:Female  Living status:Living  Apgars:9 ,9  Weight:3000 g     Magnesium Sulfate received: No BMZ received: No Rhophylac:No Transfusion:No  Physical exam  Vitals:   11/06/20 1045 11/06/20 1450 11/06/20 2022 11/07/20 0500  BP: 104/63 (!) 105/53 113/68 125/76  Pulse: 76 85 75 86  Resp: 16 16 18 16   Temp: 98.1 F (36.7 C) 98.1 F (36.7 C) 97.8 F  (36.6 C) 97.8 F (36.6 C)  TempSrc: Oral Oral Oral Oral  SpO2: 98% 100%    Weight:      Height:       General: alert, cooperative, and no distress Lochia: appropriate Uterine Fundus: firm Incision: Healing well with no significant drainage DVT Evaluation: No evidence of DVT seen on physical exam. Labs: Lab Results  Component Value Date   WBC 13.2 (H) 11/06/2020   HGB 9.9 (L) 11/06/2020   HCT 29.5 (L) 11/06/2020   MCV 84.8 11/06/2020   PLT 142 (L) 11/06/2020   No flowsheet data found. Edinburgh Score: Edinburgh Postnatal Depression Scale Screening Tool 11/06/2020  I have been able to laugh and see the funny side of things. 0  I have looked forward with enjoyment to things. 0  I have blamed myself unnecessarily when things went wrong. 0  I have been anxious or worried for no good reason. 0  I have felt scared or panicky for no good reason. 0  Things have been getting on top of me. 0  I have been so unhappy that I have had difficulty sleeping. 0  I have felt sad or miserable. 0  I have been so unhappy that I have been crying. 0  The thought of harming myself has occurred to me. 0  Edinburgh Postnatal Depression Scale Total 0      After visit meds:  Allergies as of 11/07/2020   No Known  Allergies      Medication List     TAKE these medications    ibuprofen 600 MG tablet Commonly known as: ADVIL Take 1 tablet (600 mg total) by mouth every 6 (six) hours.   iron polysaccharides 150 MG capsule Commonly known as: NIFEREX Take 1 capsule (150 mg total) by mouth daily. Start taking on: November 08, 2020   multivitamin-prenatal 27-0.8 MG Tabs tablet Take 1 tablet by mouth daily with breakfast.   oxyCODONE 5 MG immediate release tablet Commonly known as: Oxy IR/ROXICODONE Take 1 tablet (5 mg total) by mouth every 6 (six) hours as needed for moderate pain.         Discharge home in stable condition Infant Feeding: Breast Infant Disposition:home with  mother Discharge instruction: per After Visit Summary and Postpartum booklet. Activity: Advance as tolerated. Pelvic rest for 6 weeks.  Diet: carb modified diet Anticipated Birth Control: Unsure Postpartum Appointment:6 weeks Additional Postpartum F/U:  none Future Appointments:No future appointments. Follow up Visit:      11/07/2020 Vick Frees, MD

## 2020-11-07 NOTE — Progress Notes (Signed)
No c/o, tol po; ambulating, +flatus; voiding w/o difficulty; nml lochia Breastfeeding; no lightheadedness/dizziness   Patient Vitals for the past 24 hrs:  BP Temp Temp src Pulse Resp SpO2  11/07/20 0500 125/76 97.8 F (36.6 C) Oral 86 16 --  11/06/20 2022 113/68 97.8 F (36.6 C) Oral 75 18 --  11/06/20 1450 (!) 105/53 98.1 F (36.7 C) Oral 85 16 100 %   A&ox3 Rrr Ctab Abd: +bs, soft, nt, nd; fundus firm and below umb; dressing: c/d/I LE: no edema, nt bilat  CBC Latest Ref Rng & Units 11/06/2020 11/02/2020  WBC 4.0 - 10.5 K/uL 13.2(H) 9.8  Hemoglobin 12.0 - 15.0 g/dL 5.0(N) 39.7  Hematocrit 36.0 - 46.0 % 29.5(L) 36.8  Platelets 150 - 400 K/uL 142(L) 161   A/P: pod 2 s/p rltcs Doing well - wants d/c home today Gdma2 - bs all within goal; plan to contin fsbs pp, inform if bs not within goal (was on insulin/metformin with pregnancy) Breastfeeding Rh pos RI Anemia - acute d/t blood loss; asymptomatic; iron daily

## 2020-11-07 NOTE — Lactation Note (Signed)
This note was copied from a baby's chart. Lactation Consultation Note  Patient Name: Jennifer Lee [id number]'F Date: 11/07/2020 Reason for consult: Follow-up assessment;Term;Infant weight loss;Other (Comment);Nipple pain/trauma;Difficult latch;Maternal endocrine disorder (8 % weight loss / DL due to areola edema and baby having a small mouth. see LC note for LC plan.) Age:33 hours old  As LC entered the room mom pumping right breast with the DEBP .  Per mom when the baby is latching, she doesn't stay on very long .  Mom requested to try to latch. LC noted areola edema both breast and showed mom the reverse pressure technique and how deep onto the areola baby needed for proper latch. Baby has a small mouth and when she latches unable to obtain the depth even after reverse pressure Orinda Kenner mom having discomfort.  LC Plan - Goal is to establish and protect the milk supply due to difficult latch.  LC recommended due to areola edema to increase the consistent pumping with DEBP both breast 8-10 times a day for 15 -20  mins / save milk and feed it back to the baby .  Feeding volume by tonight at least 30 ml EBM 1st / formula 2nd .  Per mom plans to contact the [facility] from [facility] office for F/U appt.  Maternal Data Has patient been taught Hand Expression?: Yes  Feeding Mother's Current Feeding Choice: Breast Milk and Formula Nipple Type: Slow - flow  LATCH Score Latch: Grasps breast easily, tongue down, lips flanged, rhythmical sucking.  Audible Swallowing: A few with stimulation  Type of Nipple: Everted at rest and after stimulation (areola edema)  Comfort (Breast/Nipple): Filling, red/small blisters or bruises, mild/mod discomfort  Hold (Positioning): Assistance needed to correctly position infant at breast and maintain latch.  LATCH Score: 7   Lactation Tools Discussed/Used Tools: Pump Breast pump type: Double-Electric Breast Pump;Manual Pump Education: Milk Storage Pumping frequency: per  mom has pumped x 4 in the last 24 hours - milk is coming in Pumped volume: 20 mL  Interventions Interventions: Breast feeding basics reviewed;Assisted with latch;Skin to skin;Breast massage;Hand express;Reverse pressure;Breast compression;Adjust position;Support pillows;Position options;Education;LC Services brochure;DEBP;Hand pump  Discharge Discharge Education: Engorgement and breast care;Warning signs for feeding baby;Other (comment);Outpatient recommendation (per mom will contact the LC at per [facility] office) Pump: Personal;DEBP;Manual WIC Program: No  Consult Status Consult Status: Complete Date: 11/07/20    Kathrin Greathouse 11/07/2020, 11:10 AM

## 2020-11-20 ENCOUNTER — Telehealth (HOSPITAL_COMMUNITY): Payer: Self-pay

## 2020-11-20 NOTE — Telephone Encounter (Signed)
  No answer. Left message to return nurse call.  Marcelino Duster [facility] 11/20/2020,1543

## 2020-12-17 DIAGNOSIS — R109 Unspecified abdominal pain: Secondary | ICD-10-CM | POA: Diagnosis not present

## 2020-12-17 DIAGNOSIS — Z8632 Personal history of gestational diabetes: Secondary | ICD-10-CM | POA: Diagnosis not present

## 2021-01-03 DIAGNOSIS — Z8632 Personal history of gestational diabetes: Secondary | ICD-10-CM | POA: Diagnosis not present

## 2021-03-04 DIAGNOSIS — Z20822 Contact with and (suspected) exposure to covid-19: Secondary | ICD-10-CM | POA: Diagnosis not present

## 2021-03-04 DIAGNOSIS — U071 COVID-19: Secondary | ICD-10-CM | POA: Diagnosis not present

## 2021-03-05 DIAGNOSIS — U071 COVID-19: Secondary | ICD-10-CM | POA: Diagnosis not present

## 2021-03-11 DIAGNOSIS — Z20822 Contact with and (suspected) exposure to covid-19: Secondary | ICD-10-CM | POA: Diagnosis not present

## 2021-03-15 DIAGNOSIS — Z03818 Encounter for observation for suspected exposure to other biological agents ruled out: Secondary | ICD-10-CM | POA: Diagnosis not present

## 2021-03-15 DIAGNOSIS — Z20822 Contact with and (suspected) exposure to covid-19: Secondary | ICD-10-CM | POA: Diagnosis not present

## 2021-03-27 DIAGNOSIS — Z20822 Contact with and (suspected) exposure to covid-19: Secondary | ICD-10-CM | POA: Diagnosis not present

## 2021-06-12 DIAGNOSIS — M7662 Achilles tendinitis, left leg: Secondary | ICD-10-CM | POA: Diagnosis not present

## 2021-06-12 DIAGNOSIS — M222X2 Patellofemoral disorders, left knee: Secondary | ICD-10-CM | POA: Diagnosis not present

## 2021-06-12 DIAGNOSIS — M25562 Pain in left knee: Secondary | ICD-10-CM | POA: Diagnosis not present

## 2021-06-12 DIAGNOSIS — M76822 Posterior tibial tendinitis, left leg: Secondary | ICD-10-CM | POA: Diagnosis not present

## 2021-08-05 ENCOUNTER — Other Ambulatory Visit: Payer: Self-pay | Admitting: Internal Medicine

## 2021-08-05 ENCOUNTER — Ambulatory Visit
Admission: RE | Admit: 2021-08-05 | Discharge: 2021-08-05 | Disposition: A | Discharge status: Home / Self Care | Payer: BC Managed Care – PPO | Source: Ambulatory Visit | Attending: Internal Medicine | Admitting: Internal Medicine

## 2021-08-05 DIAGNOSIS — R0789 Other chest pain: Secondary | ICD-10-CM

## 2021-10-14 DIAGNOSIS — Z23 Encounter for immunization: Secondary | ICD-10-CM | POA: Diagnosis not present

## 2022-04-18 DIAGNOSIS — Z01419 Encounter for gynecological examination (general) (routine) without abnormal findings: Secondary | ICD-10-CM | POA: Diagnosis not present

## 2022-05-01 DIAGNOSIS — H6691 Otitis media, unspecified, right ear: Secondary | ICD-10-CM | POA: Diagnosis not present

## 2022-05-16 DIAGNOSIS — Z1329 Encounter for screening for other suspected endocrine disorder: Secondary | ICD-10-CM | POA: Diagnosis not present

## 2022-05-16 DIAGNOSIS — D259 Leiomyoma of uterus, unspecified: Secondary | ICD-10-CM | POA: Diagnosis not present

## 2022-05-16 DIAGNOSIS — Z13220 Encounter for screening for lipoid disorders: Secondary | ICD-10-CM | POA: Diagnosis not present

## 2022-05-16 DIAGNOSIS — R7309 Other abnormal glucose: Secondary | ICD-10-CM | POA: Diagnosis not present

## 2022-05-16 DIAGNOSIS — R3915 Urgency of urination: Secondary | ICD-10-CM | POA: Diagnosis not present

## 2022-05-16 DIAGNOSIS — Z0000 Encounter for general adult medical examination without abnormal findings: Secondary | ICD-10-CM | POA: Diagnosis not present

## 2022-07-02 DIAGNOSIS — L905 Scar conditions and fibrosis of skin: Secondary | ICD-10-CM | POA: Diagnosis not present

## 2022-07-02 DIAGNOSIS — D2239 Melanocytic nevi of other parts of face: Secondary | ICD-10-CM | POA: Diagnosis not present

## 2022-07-02 DIAGNOSIS — L811 Chloasma: Secondary | ICD-10-CM | POA: Diagnosis not present

## 2022-10-20 DIAGNOSIS — Z23 Encounter for immunization: Secondary | ICD-10-CM | POA: Diagnosis not present

## 2022-12-03 DIAGNOSIS — L811 Chloasma: Secondary | ICD-10-CM | POA: Diagnosis not present

## 2022-12-30 DIAGNOSIS — R109 Unspecified abdominal pain: Secondary | ICD-10-CM | POA: Diagnosis not present

## 2022-12-30 DIAGNOSIS — Z87442 Personal history of urinary calculi: Secondary | ICD-10-CM | POA: Diagnosis not present

## 2023-02-19 DIAGNOSIS — M533 Sacrococcygeal disorders, not elsewhere classified: Secondary | ICD-10-CM | POA: Diagnosis not present

## 2023-02-19 DIAGNOSIS — M25551 Pain in right hip: Secondary | ICD-10-CM | POA: Diagnosis not present
